# Patient Record
Sex: Male | Born: 1955 | Race: White | Hispanic: No | Smoking: Current every day smoker
Health system: Southern US, Community
[De-identification: ages and names within clinical notes are randomized; demographics above are authoritative.]

## PROBLEM LIST (undated history)

## (undated) DIAGNOSIS — J189 Pneumonia, unspecified organism: Secondary | ICD-10-CM

## (undated) DIAGNOSIS — I10 Essential (primary) hypertension: Secondary | ICD-10-CM

## (undated) DIAGNOSIS — Z96649 Presence of unspecified artificial hip joint: Secondary | ICD-10-CM

## (undated) DIAGNOSIS — I82409 Acute embolism and thrombosis of unspecified deep veins of unspecified lower extremity: Secondary | ICD-10-CM

## (undated) DIAGNOSIS — R4589 Other symptoms and signs involving emotional state: Secondary | ICD-10-CM

## (undated) DIAGNOSIS — J449 Chronic obstructive pulmonary disease, unspecified: Secondary | ICD-10-CM

## (undated) DIAGNOSIS — E119 Type 2 diabetes mellitus without complications: Secondary | ICD-10-CM

## (undated) HISTORY — PX: APPENDECTOMY: SHX54

## (undated) HISTORY — PX: HIP ARTHROPLASTY: SHX981

---

## 2010-08-21 ENCOUNTER — Ambulatory Visit: Payer: Self-pay | Admitting: Diagnostic Radiology

## 2010-08-21 ENCOUNTER — Emergency Department (HOSPITAL_BASED_OUTPATIENT_CLINIC_OR_DEPARTMENT_OTHER): Admission: EM | Admit: 2010-08-21 | Discharge: 2010-08-22 | Payer: Self-pay | Admitting: Emergency Medicine

## 2012-10-15 ENCOUNTER — Emergency Department (HOSPITAL_BASED_OUTPATIENT_CLINIC_OR_DEPARTMENT_OTHER): Payer: Medicare Other

## 2012-10-15 ENCOUNTER — Encounter (HOSPITAL_BASED_OUTPATIENT_CLINIC_OR_DEPARTMENT_OTHER): Payer: Self-pay | Admitting: *Deleted

## 2012-10-15 DIAGNOSIS — J40 Bronchitis, not specified as acute or chronic: Secondary | ICD-10-CM | POA: Insufficient documentation

## 2012-10-15 DIAGNOSIS — Z86718 Personal history of other venous thrombosis and embolism: Secondary | ICD-10-CM | POA: Insufficient documentation

## 2012-10-15 DIAGNOSIS — J3489 Other specified disorders of nose and nasal sinuses: Secondary | ICD-10-CM | POA: Insufficient documentation

## 2012-10-15 DIAGNOSIS — I10 Essential (primary) hypertension: Secondary | ICD-10-CM | POA: Insufficient documentation

## 2012-10-15 DIAGNOSIS — R6883 Chills (without fever): Secondary | ICD-10-CM | POA: Insufficient documentation

## 2012-10-15 DIAGNOSIS — R071 Chest pain on breathing: Secondary | ICD-10-CM | POA: Insufficient documentation

## 2012-10-15 DIAGNOSIS — F17200 Nicotine dependence, unspecified, uncomplicated: Secondary | ICD-10-CM | POA: Insufficient documentation

## 2012-10-15 DIAGNOSIS — E119 Type 2 diabetes mellitus without complications: Secondary | ICD-10-CM | POA: Insufficient documentation

## 2012-10-15 DIAGNOSIS — Z8709 Personal history of other diseases of the respiratory system: Secondary | ICD-10-CM | POA: Insufficient documentation

## 2012-10-15 DIAGNOSIS — Z8659 Personal history of other mental and behavioral disorders: Secondary | ICD-10-CM | POA: Insufficient documentation

## 2012-10-15 DIAGNOSIS — Z79899 Other long term (current) drug therapy: Secondary | ICD-10-CM | POA: Insufficient documentation

## 2012-10-15 DIAGNOSIS — R062 Wheezing: Secondary | ICD-10-CM | POA: Insufficient documentation

## 2012-10-15 DIAGNOSIS — Z7901 Long term (current) use of anticoagulants: Secondary | ICD-10-CM | POA: Insufficient documentation

## 2012-10-15 DIAGNOSIS — Z794 Long term (current) use of insulin: Secondary | ICD-10-CM | POA: Insufficient documentation

## 2012-10-15 NOTE — ED Notes (Signed)
Pt. Reports he has been coughing up dark colored mucus for 3 days with c/o his sides hurting. Pt. Has been around persons with same symptoms.  Pt. Reports he needs a Dr.s note for the next couple of days.

## 2012-10-16 ENCOUNTER — Emergency Department (HOSPITAL_BASED_OUTPATIENT_CLINIC_OR_DEPARTMENT_OTHER)
Admission: EM | Admit: 2012-10-16 | Discharge: 2012-10-16 | Disposition: A | Discharge status: Home / Self Care | Payer: Medicare Other | Attending: Emergency Medicine | Admitting: Emergency Medicine

## 2012-10-16 DIAGNOSIS — J40 Bronchitis, not specified as acute or chronic: Secondary | ICD-10-CM

## 2012-10-16 HISTORY — DX: Essential (primary) hypertension: I10

## 2012-10-16 HISTORY — DX: Type 2 diabetes mellitus without complications: E11.9

## 2012-10-16 HISTORY — DX: Acute embolism and thrombosis of unspecified deep veins of unspecified lower extremity: I82.409

## 2012-10-16 HISTORY — DX: Other symptoms and signs involving emotional state: R45.89

## 2012-10-16 MED ORDER — ALBUTEROL SULFATE (5 MG/ML) 0.5% IN NEBU
5.0000 mg | INHALATION_SOLUTION | Freq: Once | RESPIRATORY_TRACT | Status: AC
  Administered 2012-10-16: 5 mg via RESPIRATORY_TRACT

## 2012-10-16 MED ORDER — ALBUTEROL SULFATE HFA 108 (90 BASE) MCG/ACT IN AERS: 2.0000 | INHALATION_SPRAY | RESPIRATORY_TRACT | Status: DC

## 2012-10-16 MED ORDER — IPRATROPIUM BROMIDE 0.02 % IN SOLN
0.5000 mg | Freq: Once | RESPIRATORY_TRACT | Status: AC
  Administered 2012-10-16: 0.5 mg via RESPIRATORY_TRACT

## 2012-10-16 MED ORDER — IPRATROPIUM BROMIDE 0.02 % IN SOLN
RESPIRATORY_TRACT | Status: AC
  Administered 2012-10-16: 0.5 mg via RESPIRATORY_TRACT
  Filled 2012-10-16: qty 2.5

## 2012-10-16 MED ORDER — PREDNISONE 50 MG PO TABS
60.0000 mg | ORAL_TABLET | Freq: Once | ORAL | Status: AC
  Administered 2012-10-16: 60 mg via ORAL
  Filled 2012-10-16: qty 1

## 2012-10-16 MED ORDER — OXYCODONE-ACETAMINOPHEN 5-325 MG PO TABS
2.0000 | ORAL_TABLET | Freq: Once | ORAL | Status: AC
  Administered 2012-10-16: 2 via ORAL
  Filled 2012-10-16 (×2): qty 2

## 2012-10-16 MED ORDER — ALBUTEROL SULFATE (5 MG/ML) 0.5% IN NEBU
5.0000 mg | INHALATION_SOLUTION | Freq: Once | RESPIRATORY_TRACT | Status: AC
  Administered 2012-10-16: 5 mg via RESPIRATORY_TRACT
  Filled 2012-10-16: qty 1

## 2012-10-16 MED ORDER — OXYCODONE-ACETAMINOPHEN 5-325 MG PO TABS: 1.0000 | ORAL_TABLET | Freq: Three times a day (TID) | ORAL | Status: DC | PRN

## 2012-10-16 MED ORDER — ALBUTEROL SULFATE (5 MG/ML) 0.5% IN NEBU
INHALATION_SOLUTION | RESPIRATORY_TRACT | Status: AC
  Administered 2012-10-16: 5 mg via RESPIRATORY_TRACT
  Filled 2012-10-16: qty 1

## 2012-10-16 MED ORDER — ALBUTEROL SULFATE HFA 108 (90 BASE) MCG/ACT IN AERS
INHALATION_SPRAY | RESPIRATORY_TRACT | Status: AC
  Filled 2012-10-16: qty 6.7

## 2012-10-16 MED ORDER — PREDNISONE 50 MG PO TABS: ORAL_TABLET | ORAL | Status: DC

## 2012-10-16 NOTE — ED Notes (Signed)
RT at bs, neb tx in progress, pt tolerating well.

## 2012-10-16 NOTE — ED Notes (Signed)
MD at bedside. 

## 2012-10-16 NOTE — ED Provider Notes (Signed)
History  This chart was scribed for Joya Gaskins, MD by Manuela Schwartz, ED scribe. This patient was seen in room MH01/MH01 and the patient's care was started at 2326.   CSN: [id number]  Arrival date & time 10/15/12  2326   First MD Initiated Contact with Patient 10/16/12 [phone]      Chief Complaint  Patient presents with  . Cough   Patient is a 56 y.o. male presenting with cough. The history is provided by the patient. No language interpreter was used.  Cough This is a recurrent problem. The current episode started more than 2 days ago. The problem occurs constantly. The problem has been gradually worsening. The cough is productive of brown sputum. Associated symptoms include chills, rhinorrhea and wheezing. Pertinent negatives include no shortness of breath. He has tried nothing for the symptoms. He is a smoker. His past medical history is significant for bronchitis and asthma.   Raymond Kim is a 56 y.o. male who presents to the Emergency Department w/hx of asthma/bronchitis complaining of constant gradually worsening green colored productive cough for the past 3 days with associated chills, no fever. He states rhinorrhea, pleuritic chest pain. He denies SOB. He is a smoker.   Past Medical History  Diagnosis Date  . DVT (deep venous thrombosis)     in legs and chest in past.  . Depressed affect   . Hypertension   . Diabetes mellitus without complication     Past Surgical History  Procedure Date  . Hip arthroplasty   . Appendectomy     No family history on file.  History  Substance Use Topics  . Smoking status: Current Every Day Smoker -- 1.0 packs/day    Types: Cigarettes  . Smokeless tobacco: Not on file  . Alcohol Use: No      Review of Systems  Constitutional: Positive for chills. Negative for fever.  HENT: Positive for rhinorrhea.   Respiratory: Positive for cough and wheezing. Negative for shortness of breath.   Gastrointestinal: Negative for nausea and  vomiting.  Musculoskeletal: Negative for back pain.  Neurological: Negative for weakness.  All other systems reviewed and are negative.    Allergies  Review of patient's allergies indicates no known allergies.  Home Medications   Current Outpatient Rx  Name  Route  Sig  Dispense  Refill  . BUPROPION HCL ER (SR) 150 MG PO TB12   Oral   Take 150 mg by mouth 2 (two) times daily.         Marland Kitchen ESCITALOPRAM OXALATE 20 MG PO TABS   Oral   Take 20 mg by mouth daily.         Marland Kitchen GLIPIZIDE 10 MG PO TABS   Oral   Take 10 mg by mouth 2 (two) times daily before a meal.         . INSULIN ASPART PROT & ASPART (70-30) 100 UNIT/ML  SUSP   Subcutaneous   Inject into the skin 2 (two) times daily with a meal.         . LISINOPRIL 20 MG PO TABS   Oral   Take 20 mg by mouth daily.         Marland Kitchen METFORMIN HCL 1000 MG PO TABS   Oral   Take 1,000 mg by mouth 1 day or 1 dose.         . WARFARIN SODIUM 1 MG PO TABS   Oral   Take 25 mg by mouth daily.  Triage Vitals: BP 168/72  Pulse 92  Temp 97.6 F (36.4 C) (Oral)  Resp 22  Ht 5\' 8"  (1.727 m)  Wt 356 lb (161.481 kg)  BMI 54.13 kg/m2  SpO2 92%  Physical Exam CONSTITUTIONAL: Well developed/well nourished HEAD AND FACE: Normocephalic/atraumatic EYES: EOMI/PERRL ENMT: Mucous membranes moist NECK: supple no meningeal signs SPINE:entire spine nontender CV: S1/S2 noted, no murmurs/rubs/gallops noted Chest - tender to palpation, no crepitance LUNGS: , no apparent distress, wheezing bialterally ABDOMEN: soft, nontender, no rebound or guarding, obese GU:no cva tenderness NEURO: Pt is awake/alert, moves all extremitiesx4 EXTREMITIES: pulses normal, full ROM, no edema SKIN: warm, color normal PSYCH: no abnormalities of mood noted  ED Course  Procedures DIAGNOSTIC STUDIES: Oxygen Saturation is 95% on room air, adequate by my interpretation.    COORDINATION OF CARE: At 1255 AM Discussed treatment plan with patient  which includes breathing treatment, CXR. Patient agrees.   Labs Reviewed - No data to display Dg Chest 2 View  10/16/2012  *RADIOLOGY REPORT*  Clinical Data: Cough and lower back pain.  Shortness of breath. History of smoking.  CHEST - 2 VIEW  Comparison: None.  Findings: The lungs are well-aerated.  Mild vascular congestion is noted.  There is no evidence of focal opacification, pleural effusion or pneumothorax.  The heart is borderline normal in size; the mediastinal contour is within normal limits.  No acute osseous abnormalities are seen.  IMPRESSION: Mild vascular congestion noted; lungs remain grossly clear.   Original Report Authenticated By: Tonia Ghent, M.D.    2:17 AM   Pt improved.  He is in no distress, no tachypnea.  His lung sounds are improved.  Suspect bronchitis (pt is a smoker) I doubt CHF/PE.  He has focal chest wall tenderness due to cough.  Advised to quit smoking.  Also discussed strict return precautions.  He is aware that prednisone will increase glucose and need for close monitoring Defer antibiotics for now.  Does not appear to be influenza   MDM  Nursing notes including past medical history and social history reviewed and considered in documentation xrays reviewed and considered    I personally performed the services described in this documentation, which was scribed in my presence. The recorded information has been reviewed and is accurate.           Joya Gaskins, MD 10/16/12 [phone]

## 2012-10-16 NOTE — Progress Notes (Signed)
RT instructed patient in the correct use of a MDI with a spacer.  Patient demonstarted understanding of the correct procedure for using a MDI with a spacer.

## 2012-10-16 NOTE — ED Notes (Signed)
C/o cough and right-sided rib pain with coughing. Pt denies fever or chills.

## 2012-10-20 ENCOUNTER — Encounter (HOSPITAL_BASED_OUTPATIENT_CLINIC_OR_DEPARTMENT_OTHER): Payer: Self-pay | Admitting: *Deleted

## 2012-10-20 ENCOUNTER — Emergency Department (HOSPITAL_BASED_OUTPATIENT_CLINIC_OR_DEPARTMENT_OTHER): Payer: Medicare Other

## 2012-10-20 ENCOUNTER — Emergency Department (HOSPITAL_BASED_OUTPATIENT_CLINIC_OR_DEPARTMENT_OTHER)
Admission: EM | Admit: 2012-10-20 | Discharge: 2012-10-20 | Disposition: A | Discharge status: Home / Self Care | Payer: Medicare Other | Attending: Emergency Medicine | Admitting: Emergency Medicine

## 2012-10-20 DIAGNOSIS — F329 Major depressive disorder, single episode, unspecified: Secondary | ICD-10-CM | POA: Insufficient documentation

## 2012-10-20 DIAGNOSIS — F3289 Other specified depressive episodes: Secondary | ICD-10-CM | POA: Insufficient documentation

## 2012-10-20 DIAGNOSIS — R739 Hyperglycemia, unspecified: Secondary | ICD-10-CM

## 2012-10-20 DIAGNOSIS — Z7901 Long term (current) use of anticoagulants: Secondary | ICD-10-CM | POA: Insufficient documentation

## 2012-10-20 DIAGNOSIS — Z79899 Other long term (current) drug therapy: Secondary | ICD-10-CM | POA: Insufficient documentation

## 2012-10-20 DIAGNOSIS — Z794 Long term (current) use of insulin: Secondary | ICD-10-CM | POA: Insufficient documentation

## 2012-10-20 DIAGNOSIS — J45901 Unspecified asthma with (acute) exacerbation: Secondary | ICD-10-CM | POA: Insufficient documentation

## 2012-10-20 DIAGNOSIS — E1169 Type 2 diabetes mellitus with other specified complication: Secondary | ICD-10-CM | POA: Insufficient documentation

## 2012-10-20 DIAGNOSIS — I10 Essential (primary) hypertension: Secondary | ICD-10-CM | POA: Insufficient documentation

## 2012-10-20 DIAGNOSIS — F17200 Nicotine dependence, unspecified, uncomplicated: Secondary | ICD-10-CM | POA: Insufficient documentation

## 2012-10-20 DIAGNOSIS — I82409 Acute embolism and thrombosis of unspecified deep veins of unspecified lower extremity: Secondary | ICD-10-CM | POA: Insufficient documentation

## 2012-10-20 LAB — COMPREHENSIVE METABOLIC PANEL
Alkaline Phosphatase: 77 U/L (ref 39–117)
BUN: 25 mg/dL — ABNORMAL HIGH (ref 6–23)
CO2: 27 mEq/L (ref 19–32)
Calcium: 9.6 mg/dL (ref 8.4–10.5)
GFR calc Af Amer: >90 mL/min (ref >90)
GFR calc non Af Amer: >90 mL/min (ref >90)
Glucose, Bld: 326 mg/dL — ABNORMAL HIGH (ref 70–99)
Potassium: 4.1 mEq/L (ref 3.5–5.1)
Total Protein: 6.9 g/dL (ref 6.0–8.3)

## 2012-10-20 LAB — CBC WITH DIFFERENTIAL/PLATELET
Eosinophils Absolute: 0.1 10*3/uL (ref 0.0–0.7)
Eosinophils Relative: 1 % (ref 0–5)
Hemoglobin: 13 g/dL (ref 13.0–17.0)
Lymphocytes Relative: 42 % (ref 12–46)
Lymphs Abs: 3.6 10*3/uL (ref 0.7–4.0)
MCH: 30.4 pg (ref 26.0–34.0)
MCV: 97.9 fL (ref 78.0–100.0)
Monocytes Relative: 6 % (ref 3–12)
RBC: 4.28 MIL/uL (ref 4.22–5.81)
WBC: 8.5 10*3/uL (ref 4.0–10.5)

## 2012-10-20 LAB — TROPONIN I: Troponin I: <0.3 ng/mL (ref <0.30)

## 2012-10-20 MED ORDER — INSULIN REGULAR HUMAN 100 UNIT/ML IJ SOLN
10.0000 [IU] | Freq: Once | INTRAMUSCULAR | Status: AC
  Administered 2012-10-20: 10 [IU] via INTRAVENOUS

## 2012-10-20 MED ORDER — SODIUM CHLORIDE 0.9 % IV BOLUS (SEPSIS)
2000.0000 mL | Freq: Once | INTRAVENOUS | Status: AC
  Administered 2012-10-20: 2000 mL via INTRAVENOUS

## 2012-10-20 MED ORDER — INSULIN REGULAR HUMAN 100 UNIT/ML IJ SOLN
INTRAMUSCULAR | Status: AC
  Filled 2012-10-20: qty 1

## 2012-10-20 MED ORDER — ALBUTEROL SULFATE (5 MG/ML) 0.5% IN NEBU
5.0000 mg | INHALATION_SOLUTION | RESPIRATORY_TRACT | Status: AC
  Administered 2012-10-20 (×3): 5 mg via RESPIRATORY_TRACT
  Filled 2012-10-20 (×3): qty 1

## 2012-10-20 MED ORDER — ALBUTEROL SULFATE HFA 108 (90 BASE) MCG/ACT IN AERS: 1.0000 | INHALATION_SPRAY | Freq: Four times a day (QID) | RESPIRATORY_TRACT | Status: DC | PRN

## 2012-10-20 MED ORDER — IPRATROPIUM BROMIDE 0.02 % IN SOLN
0.5000 mg | RESPIRATORY_TRACT | Status: AC
  Administered 2012-10-20 (×3): 0.5 mg via RESPIRATORY_TRACT
  Filled 2012-10-20 (×3): qty 2.5

## 2012-10-20 NOTE — ED Provider Notes (Addendum)
History     CSN: [id number]  Arrival date & time 10/20/12  0123   First MD Initiated Contact with Patient 10/20/12 0134      Chief Complaint  Patient presents with  . Shortness of Breath    (Consider location/radiation/quality/duration/timing/severity/associated sxs/prior treatment) The history is provided by the patient.  Raymond Kim is a 57 y.o. male history of hypertension, DM, DVT on Coumadin here with shortness of breath and cough. He has a nonproductive cough for the last 2 weeks. Came a week ago and was prescribed steroids and albuterol. He finished steroids today. Now he has more cough. He also ran out of his insulin yesterday and his sugars were elevated. Denies any fevers or chills or vomiting. Able to get his insulin couple hours ago and gave himself a shot of novolog.    Past Medical History  Diagnosis Date  . DVT (deep venous thrombosis)     in legs and chest in past.  . Depressed affect   . Hypertension   . Diabetes mellitus without complication     Past Surgical History  Procedure Date  . Hip arthroplasty   . Appendectomy     History reviewed. No pertinent family history.  History  Substance Use Topics  . Smoking status: Current Every Day Smoker -- 1.0 packs/day    Types: Cigarettes  . Smokeless tobacco: Not on file  . Alcohol Use: No      Review of Systems  Respiratory: Positive for cough and shortness of breath.   All other systems reviewed and are negative.    Allergies  Review of patient's allergies indicates no known allergies.  Home Medications   Current Outpatient Rx  Name  Route  Sig  Dispense  Refill  . BUPROPION HCL ER (SR) 150 MG PO TB12   Oral   Take 150 mg by mouth 2 (two) times daily.         Marland Kitchen ESCITALOPRAM OXALATE 20 MG PO TABS   Oral   Take 20 mg by mouth daily.         Marland Kitchen GLIPIZIDE 10 MG PO TABS   Oral   Take 10 mg by mouth 2 (two) times daily before a meal.         . INSULIN ASPART PROT & ASPART (70-30) 100  UNIT/ML [place] SUSP   Subcutaneous   Inject into the skin 2 (two) times daily with a meal.         . LISINOPRIL 20 MG PO TABS   Oral   Take 20 mg by mouth daily.         Marland Kitchen METFORMIN HCL 1000 MG PO TABS   Oral   Take 1,000 mg by mouth 1 day or 1 dose.         Marland Kitchen PREDNISONE 50 MG PO TABS      One tablet PO daily for 4 days Please check your sugar frequently and adjust insulin as needed   4 tablet   0   . WARFARIN SODIUM 1 MG PO TABS   Oral   Take 25 mg by mouth daily.         . OXYCODONE-ACETAMINOPHEN 5-325 MG PO TABS   Oral   Take 1 tablet by mouth every 8 (eight) hours as needed for pain.   5 tablet   0     BP 172/64  Pulse 92  Temp 98.9 F (37.2 C) (Oral)  Resp 20  SpO2 93%  Physical Exam  Nursing note and vitals reviewed. Constitutional: He is oriented to person, place, and time.       Obese, NAD   HENT:  Head: Normocephalic.  Mouth/Throat: Oropharynx is clear and moist.  Eyes: Conjunctivae normal are normal. Pupils are equal, round, and reactive to light.  Neck: Normal range of motion. Neck supple.  Cardiovascular: Normal rate, regular rhythm and normal heart sounds.   Pulmonary/Chest: Effort normal.       Minimal wheezing, no retractions, good air movement   Abdominal: Soft. Bowel sounds are normal. He exhibits no distension. There is no tenderness. There is no rebound.  Musculoskeletal: Normal range of motion.  Neurological: He is alert and oriented to person, place, and time.  Skin: Skin is warm and dry.  Psychiatric: He has a normal mood and affect. His behavior is normal. Judgment and thought content normal.    ED Course  Procedures (including critical care time)  Labs Reviewed  GLUCOSE, CAPILLARY - Abnormal; Notable for the following:    Glucose-Capillary 538 (*)     All other components within normal limits  CBC WITH DIFFERENTIAL - Abnormal; Notable for the following:    RDW 15.9 (*)     Platelets 143 (*)     All other components within  normal limits  COMPREHENSIVE METABOLIC PANEL - Abnormal; Notable for the following:    Glucose, Bld 326 (*)     BUN 25 (*)     All other components within normal limits  GLUCOSE, CAPILLARY - Abnormal; Notable for the following:    Glucose-Capillary 309 (*)     All other components within normal limits  TROPONIN I   Dg Chest 2 View  10/20/2012  *RADIOLOGY REPORT*  Clinical Data: Cough history smoking, hypertension, diabetes  CHEST - 2 VIEW  Comparison: 10/16/2012  Findings: Minimal enlargement of cardiac silhouette. Mediastinal contours and pulmonary vascularity normal. Peribronchial thickening without pulmonary infiltrate, pleural effusion or pneumothorax. No acute osseous findings.  IMPRESSION: Mild chronic bronchitic changes. Minimal enlargement of cardiac silhouette.   Original Report Authenticated By: Ulyses Southward, M.D.      No diagnosis found.   Date: 10/20/2012  Rate: 86  Rhythm: normal sinus rhythm  QRS Axis: normal  Intervals: normal  ST/T Wave abnormalities: normal  Conduction Disutrbances:none  Narrative Interpretation:   Old EKG Reviewed: none available    MDM  Jewett Mcgann is a 57 y.o. male here with cough, SOB, wheezing, and hyperglycemia. Likely has mild asthma exacerbation. CXR clear. Will give albuterol x 3 and reassess. I held off of steroids due to hyperglycemia. CBG was 500 initially, dec to 300 with 2 L NS and 10 U regular insulin. No anion gap on CMP.   4:37 AM Patient felt better. No wheezing on exam. Never hypoxic here. Will d/c home with albuterol. I recommend that he take his insulin appropriately. Return precautions given.           Richardean Canal, MD 10/20/12 1610  Richardean Canal, MD 10/20/12 [phone]

## 2012-10-20 NOTE — ED Notes (Signed)
MD at bedside. 

## 2012-10-20 NOTE — ED Notes (Addendum)
Pt c/o SOB x 1 week, has been seen here for same this week. Pt states he needs some more steroid pills, finished his last dose this morning.

## 2012-10-20 NOTE — ED Notes (Signed)
RTT at bedside

## 2012-10-20 NOTE — ED Notes (Signed)
Patient's blood glucose 538. MD made aware.

## 2012-10-20 NOTE — ED Notes (Signed)
2nd liter of NS infusing at this time, IV site unremarkable

## 2012-10-20 NOTE — ED Notes (Signed)
CBG 309 

## 2013-01-04 ENCOUNTER — Emergency Department (HOSPITAL_BASED_OUTPATIENT_CLINIC_OR_DEPARTMENT_OTHER): Payer: Medicare Other

## 2013-01-04 ENCOUNTER — Encounter (HOSPITAL_BASED_OUTPATIENT_CLINIC_OR_DEPARTMENT_OTHER): Payer: Self-pay | Admitting: Emergency Medicine

## 2013-01-04 ENCOUNTER — Emergency Department (HOSPITAL_BASED_OUTPATIENT_CLINIC_OR_DEPARTMENT_OTHER)
Admission: EM | Admit: 2013-01-04 | Discharge: 2013-01-04 | Disposition: A | Discharge status: Home / Self Care | Payer: Medicare Other | Attending: Emergency Medicine | Admitting: Emergency Medicine

## 2013-01-04 DIAGNOSIS — Z794 Long term (current) use of insulin: Secondary | ICD-10-CM | POA: Insufficient documentation

## 2013-01-04 DIAGNOSIS — I10 Essential (primary) hypertension: Secondary | ICD-10-CM | POA: Insufficient documentation

## 2013-01-04 DIAGNOSIS — Z79899 Other long term (current) drug therapy: Secondary | ICD-10-CM | POA: Insufficient documentation

## 2013-01-04 DIAGNOSIS — F3289 Other specified depressive episodes: Secondary | ICD-10-CM | POA: Insufficient documentation

## 2013-01-04 DIAGNOSIS — F17200 Nicotine dependence, unspecified, uncomplicated: Secondary | ICD-10-CM | POA: Insufficient documentation

## 2013-01-04 DIAGNOSIS — IMO0002 Reserved for concepts with insufficient information to code with codable children: Secondary | ICD-10-CM | POA: Insufficient documentation

## 2013-01-04 DIAGNOSIS — E119 Type 2 diabetes mellitus without complications: Secondary | ICD-10-CM | POA: Insufficient documentation

## 2013-01-04 DIAGNOSIS — F329 Major depressive disorder, single episode, unspecified: Secondary | ICD-10-CM | POA: Insufficient documentation

## 2013-01-04 DIAGNOSIS — S8002XA Contusion of left knee, initial encounter: Secondary | ICD-10-CM

## 2013-01-04 DIAGNOSIS — Z7901 Long term (current) use of anticoagulants: Secondary | ICD-10-CM | POA: Insufficient documentation

## 2013-01-04 DIAGNOSIS — Z86718 Personal history of other venous thrombosis and embolism: Secondary | ICD-10-CM | POA: Insufficient documentation

## 2013-01-04 DIAGNOSIS — S8000XA Contusion of unspecified knee, initial encounter: Secondary | ICD-10-CM | POA: Insufficient documentation

## 2013-01-04 DIAGNOSIS — Y9301 Activity, walking, marching and hiking: Secondary | ICD-10-CM | POA: Insufficient documentation

## 2013-01-04 DIAGNOSIS — Y929 Unspecified place or not applicable: Secondary | ICD-10-CM | POA: Insufficient documentation

## 2013-01-04 MED ORDER — HYDROCODONE-ACETAMINOPHEN 5-325 MG PO TABS: 2.0000 | ORAL_TABLET | ORAL | Status: AC | PRN

## 2013-01-04 NOTE — ED Provider Notes (Signed)
History     CSN: [id number]  Arrival date & time 01/04/13  1704   First MD Initiated Contact with Patient 01/04/13 1748      Chief Complaint  Patient presents with  . Knee Injury  . Back Pain    (Consider location/radiation/quality/duration/timing/severity/associated sxs/prior treatment) Patient is a 57 y.o. male presenting with knee pain. The history is provided by the patient.  Knee Pain Location:  Knee Time since incident:  4 days Injury: yes   Mechanism of injury comment:  Struck on tool box Knee location:  L knee Pain details:    Quality:  Throbbing   Radiates to:  Does not radiate   Severity:  Severe   Onset quality:  Sudden   Duration:  4 days   Timing:  Constant   Progression:  Worsening Chronicity:  New Dislocation: no   Foreign body present:  No foreign bodies Prior injury to area:  Yes Relieved by:  Nothing Worsened by:  Activity, bearing weight, flexion and extension Ineffective treatments:  None tried Associated symptoms: no fever     Past Medical History  Diagnosis Date  . DVT (deep venous thrombosis)     in legs and chest in past.  . Depressed affect   . Hypertension   . Diabetes mellitus without complication     Past Surgical History  Procedure Laterality Date  . Hip arthroplasty    . Appendectomy      No family history on file.  History  Substance Use Topics  . Smoking status: Current Every Day Smoker -- 1.00 packs/day    Types: Cigarettes  . Smokeless tobacco: Not on file  . Alcohol Use: No      Review of Systems  Constitutional: Negative for fever.  All other systems reviewed and are negative.    Allergies  Review of patient's allergies indicates no known allergies.  Home Medications   Current Outpatient Rx  Name  Route  Sig  Dispense  Refill  . buPROPion (WELLBUTRIN SR) 150 MG 12 hr tablet   Oral   Take 150 mg by mouth 2 (two) times daily.         Marland Kitchen escitalopram (LEXAPRO) 20 MG tablet   Oral   Take 20 mg by  mouth daily.         Marland Kitchen glipiZIDE (GLUCOTROL) 10 MG tablet   Oral   Take 10 mg by mouth 2 (two) times daily before a meal.         . insulin aspart protamine-insulin aspart (NOVOLOG 70/30) (70-30) 100 UNIT/ML injection   Subcutaneous   Inject into the skin 2 (two) times daily with a meal.         . lisinopril (PRINIVIL,ZESTRIL) 20 MG tablet   Oral   Take 20 mg by mouth daily.         . metFORMIN (GLUCOPHAGE) 1000 MG tablet   Oral   Take 1,000 mg by mouth 1 day or 1 dose.         . warfarin (COUMADIN) 1 MG tablet   Oral   Take 25 mg by mouth daily.           BP 120/78  Pulse 95  Temp(Src) 98.1 F (36.7 C) (Oral)  Resp 20  Ht 5\' 8"  (1.727 m)  Wt 340 lb (154.223 kg)  BMI 51.71 kg/m2  SpO2 96%  Physical Exam  Nursing note and vitals reviewed. Constitutional: He is oriented to person, place, and time. He appears well-developed  and well-nourished. No distress.  HENT:  Head: Normocephalic and atraumatic.  Mouth/Throat: Oropharynx is clear and moist.  Neck: Normal range of motion. Neck supple.  Musculoskeletal: Normal range of motion.  The left knee appears grossly normal.  There is mild swelling inferior to the patella, but no effusion.  There is pain with range of motion limiting the exam, however the knee appears stable ap and laterally.    Neurological: He is alert and oriented to person, place, and time.  Skin: Skin is warm. He is not diaphoretic.    ED Course  Procedures (including critical care time)  Labs Reviewed - No data to display No results found.   No diagnosis found.    MDM  Xrays negative, appears to be a contusion.  Will treat with rest, pain meds.  Follow up with his orthopedist prn if not improving.        Geoffery Lyons, MD 01/04/13 [phone]

## 2013-01-04 NOTE — ED Notes (Signed)
Pt walked into a tool box three days ago.  Pt c/o pain in left knee and lower back.

## 2013-01-18 ENCOUNTER — Emergency Department (HOSPITAL_BASED_OUTPATIENT_CLINIC_OR_DEPARTMENT_OTHER)
Admission: EM | Admit: 2013-01-18 | Discharge: 2013-01-18 | Disposition: A | Discharge status: Home / Self Care | Payer: Medicare Other | Attending: Emergency Medicine | Admitting: Emergency Medicine

## 2013-01-18 ENCOUNTER — Encounter (HOSPITAL_BASED_OUTPATIENT_CLINIC_OR_DEPARTMENT_OTHER): Payer: Self-pay | Admitting: Emergency Medicine

## 2013-01-18 ENCOUNTER — Emergency Department (HOSPITAL_BASED_OUTPATIENT_CLINIC_OR_DEPARTMENT_OTHER): Payer: Medicare Other

## 2013-01-18 DIAGNOSIS — I10 Essential (primary) hypertension: Secondary | ICD-10-CM | POA: Insufficient documentation

## 2013-01-18 DIAGNOSIS — E669 Obesity, unspecified: Secondary | ICD-10-CM | POA: Insufficient documentation

## 2013-01-18 DIAGNOSIS — F3289 Other specified depressive episodes: Secondary | ICD-10-CM | POA: Insufficient documentation

## 2013-01-18 DIAGNOSIS — Z794 Long term (current) use of insulin: Secondary | ICD-10-CM | POA: Insufficient documentation

## 2013-01-18 DIAGNOSIS — Z86718 Personal history of other venous thrombosis and embolism: Secondary | ICD-10-CM | POA: Insufficient documentation

## 2013-01-18 DIAGNOSIS — F17200 Nicotine dependence, unspecified, uncomplicated: Secondary | ICD-10-CM | POA: Insufficient documentation

## 2013-01-18 DIAGNOSIS — R05 Cough: Secondary | ICD-10-CM | POA: Insufficient documentation

## 2013-01-18 DIAGNOSIS — F329 Major depressive disorder, single episode, unspecified: Secondary | ICD-10-CM | POA: Insufficient documentation

## 2013-01-18 DIAGNOSIS — R0602 Shortness of breath: Secondary | ICD-10-CM | POA: Insufficient documentation

## 2013-01-18 DIAGNOSIS — J209 Acute bronchitis, unspecified: Secondary | ICD-10-CM | POA: Insufficient documentation

## 2013-01-18 DIAGNOSIS — J40 Bronchitis, not specified as acute or chronic: Secondary | ICD-10-CM

## 2013-01-18 DIAGNOSIS — Z7901 Long term (current) use of anticoagulants: Secondary | ICD-10-CM | POA: Insufficient documentation

## 2013-01-18 DIAGNOSIS — M549 Dorsalgia, unspecified: Secondary | ICD-10-CM | POA: Insufficient documentation

## 2013-01-18 DIAGNOSIS — R059 Cough, unspecified: Secondary | ICD-10-CM | POA: Insufficient documentation

## 2013-01-18 DIAGNOSIS — E119 Type 2 diabetes mellitus without complications: Secondary | ICD-10-CM | POA: Insufficient documentation

## 2013-01-18 DIAGNOSIS — J45909 Unspecified asthma, uncomplicated: Secondary | ICD-10-CM | POA: Insufficient documentation

## 2013-01-18 DIAGNOSIS — Z79899 Other long term (current) drug therapy: Secondary | ICD-10-CM | POA: Insufficient documentation

## 2013-01-18 MED ORDER — AZITHROMYCIN 250 MG PO TABS: 250.0000 mg | ORAL_TABLET | Freq: Every day | ORAL | Status: AC

## 2013-01-18 MED ORDER — ALBUTEROL SULFATE HFA 108 (90 BASE) MCG/ACT IN AERS
2.0000 | INHALATION_SPRAY | RESPIRATORY_TRACT | Status: DC | PRN
  Administered 2013-01-18: 2 via RESPIRATORY_TRACT
  Filled 2013-01-18: qty 6.7

## 2013-01-18 MED ORDER — IPRATROPIUM BROMIDE 0.02 % IN SOLN
0.5000 mg | Freq: Once | RESPIRATORY_TRACT | Status: AC
  Administered 2013-01-18: 0.5 mg via RESPIRATORY_TRACT
  Filled 2013-01-18: qty 2.5

## 2013-01-18 MED ORDER — HYDROCODONE-HOMATROPINE 5-1.5 MG/5ML PO SYRP: 5.0000 mL | ORAL_SOLUTION | Freq: Four times a day (QID) | ORAL | Status: AC | PRN

## 2013-01-18 MED ORDER — PREDNISONE 20 MG PO TABS
20.0000 mg | ORAL_TABLET | Freq: Once | ORAL | Status: AC
  Administered 2013-01-18: 20 mg via ORAL
  Filled 2013-01-18: qty 1

## 2013-01-18 MED ORDER — PREDNISONE 20 MG PO TABS: 20.0000 mg | ORAL_TABLET | Freq: Two times a day (BID) | ORAL | Status: AC

## 2013-01-18 MED ORDER — ALBUTEROL SULFATE (5 MG/ML) 0.5% IN NEBU
5.0000 mg | INHALATION_SOLUTION | Freq: Once | RESPIRATORY_TRACT | Status: AC
  Administered 2013-01-18: 5 mg via RESPIRATORY_TRACT
  Filled 2013-01-18: qty 1

## 2013-01-18 NOTE — ED Notes (Signed)
Family at bedside. 

## 2013-01-18 NOTE — Discharge Instructions (Signed)

## 2013-01-18 NOTE — Patient Instructions (Signed)
Instructed pt on the proper use of administering albuteral mdi via aerochamber pt tolerated well 

## 2013-01-18 NOTE — ED Notes (Signed)
MD at bedside. 

## 2013-01-18 NOTE — ED Notes (Signed)
SOB x4 days.  Audible wheezing.  Rales bil.

## 2013-01-18 NOTE — ED Provider Notes (Signed)
History     CSN: [id number]  Arrival date & time 01/18/13  0013   First MD Initiated Contact with Patient 01/18/13 [phone]      Chief Complaint  Patient presents with  . Wheezing    (Consider location/radiation/quality/duration/timing/severity/associated sxs/prior treatment) HPI This is a 57 year old male with a history of asthma and chronic bronchitis. He states he develops breathing difficulty typically every spring. He is here with a four-day history of shortness of breath, wheezing and occasionally productive cough. The symptoms are moderate and worse when lying supine are exerting himself. He does not have an inhaler but has used an inhaler in the past. He has some upper back pain which she attributes to coughing; this is worse with movement, coughing or deep breathing. He denies fever or chills.    Past Medical History  Diagnosis Date  . DVT (deep venous thrombosis)     in legs and chest in past.  . Depressed affect   . Hypertension   . Diabetes mellitus without complication     Past Surgical History  Procedure Laterality Date  . Hip arthroplasty    . Appendectomy      No family history on file.  History  Substance Use Topics  . Smoking status: Current Every Day Smoker -- 1.00 packs/day    Types: Cigarettes  . Smokeless tobacco: Not on file  . Alcohol Use: No      Review of Systems  All other systems reviewed and are negative.    Allergies  Review of patient's allergies indicates no known allergies.  Home Medications   Current Outpatient Rx  Name  Route  Sig  Dispense  Refill  . buPROPion (WELLBUTRIN SR) 150 MG 12 hr tablet   Oral   Take 150 mg by mouth 2 (two) times daily.         Marland Kitchen escitalopram (LEXAPRO) 20 MG tablet   Oral   Take 20 mg by mouth daily.         Marland Kitchen glipiZIDE (GLUCOTROL) 10 MG tablet   Oral   Take 10 mg by mouth 2 (two) times daily before a meal.         . HYDROcodone-acetaminophen (NORCO) 5-325 MG per tablet   Oral   Take  2 tablets by mouth every 4 (four) hours as needed for pain.   20 tablet   0   . insulin aspart protamine-insulin aspart (NOVOLOG 70/30) (70-30) 100 UNIT/ML injection   Subcutaneous   Inject into the skin 2 (two) times daily with a meal.         . lisinopril (PRINIVIL,ZESTRIL) 20 MG tablet   Oral   Take 20 mg by mouth daily.         . metFORMIN (GLUCOPHAGE) 1000 MG tablet   Oral   Take 1,000 mg by mouth 1 day or 1 dose.         . warfarin (COUMADIN) 1 MG tablet   Oral   Take 25 mg by mouth daily.           BP 149/79  Pulse 97  Temp(Src) 98.4 F (36.9 C) (Oral)  Resp 20  Ht 5\' 8"  (1.727 m)  Wt 340 lb (154.223 kg)  BMI 51.71 kg/m2  SpO2 87%  Physical Exam General: Well-developed, obese male in no acute distress; appearance consistent with age of record HENT: normocephalic, atraumatic Eyes: pupils equal round and reactive to light; extraocular muscles intact Neck: supple Heart: regular rate and rhythm Lungs: Distant  sounds; rattly cough Abdomen: soft; obese; nontender Extremities: No deformity; full range of motion; +1 edema of lower legs Neurologic: Awake, alert and oriented; motor function intact in all extremities and symmetric; no facial droop Skin: Warm and dry Psychiatric: Normal mood and affect    ED Course  Procedures (including critical care time)     MDM  Nursing notes and vitals signs, including pulse oximetry, reviewed.  Summary of this visit's results, reviewed by myself:  Imaging Studies: Dg Chest 2 View  01/18/2013  *RADIOLOGY REPORT*  Clinical Data: Wheezing  CHEST - 2 VIEW  Comparison: 10/20/2012  Findings: Central bronchitic changes.  No peripheral consolidation. Normal heart size.  No pneumothorax and no pleural effusion.  IMPRESSION: Bronchitic changes.   Original Report Authenticated By: Jolaine Click, M.D.    1:05 AM Patient states breathing is improved after albuterol and Atrovent treatment. He states his primary care physician  usually treats these episodes with Zithromax and a steroid course.         Hanley Seamen, MD 01/18/13 [phone]

## 2013-02-24 ENCOUNTER — Emergency Department (HOSPITAL_BASED_OUTPATIENT_CLINIC_OR_DEPARTMENT_OTHER): Payer: Medicare Other

## 2013-02-24 ENCOUNTER — Emergency Department (HOSPITAL_BASED_OUTPATIENT_CLINIC_OR_DEPARTMENT_OTHER)
Admission: EM | Admit: 2013-02-24 | Discharge: 2013-02-24 | Discharge status: Against Medical Advice | Payer: Medicare Other | Attending: Emergency Medicine | Admitting: Emergency Medicine

## 2013-02-24 ENCOUNTER — Encounter (HOSPITAL_BASED_OUTPATIENT_CLINIC_OR_DEPARTMENT_OTHER): Payer: Self-pay | Admitting: *Deleted

## 2013-02-24 DIAGNOSIS — R109 Unspecified abdominal pain: Secondary | ICD-10-CM | POA: Insufficient documentation

## 2013-02-24 DIAGNOSIS — F17200 Nicotine dependence, unspecified, uncomplicated: Secondary | ICD-10-CM | POA: Insufficient documentation

## 2013-02-24 DIAGNOSIS — E119 Type 2 diabetes mellitus without complications: Secondary | ICD-10-CM | POA: Insufficient documentation

## 2013-02-24 DIAGNOSIS — I10 Essential (primary) hypertension: Secondary | ICD-10-CM | POA: Insufficient documentation

## 2013-02-24 HISTORY — DX: Pneumonia, unspecified organism: J18.9

## 2013-02-24 LAB — URINE MICROSCOPIC-ADD ON

## 2013-02-24 LAB — URINALYSIS, ROUTINE W REFLEX MICROSCOPIC
Nitrite: NEGATIVE
Specific Gravity, Urine: 1.027 (ref 1.005–1.030)
Urobilinogen, UA: 1 mg/dL (ref 0.0–1.0)
pH: 6 (ref 5.0–8.0)

## 2013-02-24 LAB — GLUCOSE, CAPILLARY: Glucose-Capillary: 422 mg/dL — ABNORMAL HIGH (ref 70–99)

## 2013-02-24 NOTE — ED Notes (Signed)
Pt called x1 to be roomed and no answer

## 2013-02-24 NOTE — ED Notes (Addendum)
Pt c/o right flank pain x 3 days. Denies injury or other s/s. Pt states he ran out of his Metformin but is taking girlfriends and also ran out of insulin today. Supposed to get some more tomorrow.

## 2013-02-26 ENCOUNTER — Emergency Department (HOSPITAL_BASED_OUTPATIENT_CLINIC_OR_DEPARTMENT_OTHER)
Admission: EM | Admit: 2013-02-26 | Discharge: 2013-02-26 | Disposition: A | Discharge status: Home / Self Care | Payer: Medicare Other | Attending: Emergency Medicine | Admitting: Emergency Medicine

## 2013-02-26 ENCOUNTER — Encounter (HOSPITAL_BASED_OUTPATIENT_CLINIC_OR_DEPARTMENT_OTHER): Payer: Self-pay | Admitting: *Deleted

## 2013-02-26 DIAGNOSIS — Z86718 Personal history of other venous thrombosis and embolism: Secondary | ICD-10-CM | POA: Insufficient documentation

## 2013-02-26 DIAGNOSIS — M545 Low back pain, unspecified: Secondary | ICD-10-CM | POA: Insufficient documentation

## 2013-02-26 DIAGNOSIS — Z79899 Other long term (current) drug therapy: Secondary | ICD-10-CM | POA: Insufficient documentation

## 2013-02-26 DIAGNOSIS — IMO0002 Reserved for concepts with insufficient information to code with codable children: Secondary | ICD-10-CM | POA: Insufficient documentation

## 2013-02-26 DIAGNOSIS — M549 Dorsalgia, unspecified: Secondary | ICD-10-CM

## 2013-02-26 DIAGNOSIS — I10 Essential (primary) hypertension: Secondary | ICD-10-CM | POA: Insufficient documentation

## 2013-02-26 DIAGNOSIS — Z8701 Personal history of pneumonia (recurrent): Secondary | ICD-10-CM | POA: Insufficient documentation

## 2013-02-26 DIAGNOSIS — F329 Major depressive disorder, single episode, unspecified: Secondary | ICD-10-CM | POA: Insufficient documentation

## 2013-02-26 DIAGNOSIS — R05 Cough: Secondary | ICD-10-CM | POA: Insufficient documentation

## 2013-02-26 DIAGNOSIS — F3289 Other specified depressive episodes: Secondary | ICD-10-CM | POA: Insufficient documentation

## 2013-02-26 DIAGNOSIS — E119 Type 2 diabetes mellitus without complications: Secondary | ICD-10-CM | POA: Insufficient documentation

## 2013-02-26 DIAGNOSIS — R059 Cough, unspecified: Secondary | ICD-10-CM | POA: Insufficient documentation

## 2013-02-26 DIAGNOSIS — F17200 Nicotine dependence, unspecified, uncomplicated: Secondary | ICD-10-CM | POA: Insufficient documentation

## 2013-02-26 LAB — URINALYSIS, ROUTINE W REFLEX MICROSCOPIC
Glucose, UA: NEGATIVE mg/dL
Hgb urine dipstick: NEGATIVE
Ketones, ur: NEGATIVE mg/dL
Protein, ur: NEGATIVE mg/dL
Urobilinogen, UA: 1 mg/dL (ref 0.0–1.0)

## 2013-02-26 MED ORDER — KETOROLAC TROMETHAMINE 60 MG/2ML IM SOLN
60.0000 mg | Freq: Once | INTRAMUSCULAR | Status: AC
  Administered 2013-02-26: 60 mg via INTRAMUSCULAR
  Filled 2013-02-26: qty 2

## 2013-02-26 MED ORDER — HYDROCODONE-ACETAMINOPHEN 5-325 MG PO TABS: 2.0000 | ORAL_TABLET | ORAL | Status: AC | PRN

## 2013-02-26 MED ORDER — HYDROCODONE-ACETAMINOPHEN 5-325 MG PO TABS
2.0000 | ORAL_TABLET | Freq: Once | ORAL | Status: AC
  Administered 2013-02-26: 2 via ORAL
  Filled 2013-02-26: qty 2

## 2013-02-26 NOTE — ED Notes (Signed)
Back pain x 3 days 

## 2013-02-26 NOTE — ED Provider Notes (Signed)
History     CSN: [id number]  Arrival date & time 02/26/13  1325   First MD Initiated Contact with Patient 02/26/13 1336      Chief Complaint  Patient presents with  . Back Pain    (Consider location/radiation/quality/duration/timing/severity/associated sxs/prior treatment) HPI Comments: Patient recently admitted at [facility] for pneumonia.  Has been coughing a lot.  He coughed three days ago and developed pain in the right flank.  It is worse with movement, palpation.  No productive cough.  No urinary complaints.  No fevers or chills.    Patient is a 57 y.o. male presenting with back pain. The history is provided by the patient.  Back Pain Location:  Lumbar spine Quality:  Shooting and stabbing Radiates to:  Does not radiate Pain severity:  Severe Pain is:  Same all the time Onset quality:  Sudden Timing:  Constant Progression:  Worsening   Past Medical History  Diagnosis Date  . DVT (deep venous thrombosis)     in legs and chest in past.  . Depressed affect   . Hypertension   . Diabetes mellitus without complication   . Pneumonia     Past Surgical History  Procedure Laterality Date  . Hip arthroplasty    . Appendectomy      No family history on file.  History  Substance Use Topics  . Smoking status: Current Every Day Smoker -- 1.00 packs/day    Types: Cigarettes  . Smokeless tobacco: Not on file  . Alcohol Use: No      Review of Systems  Musculoskeletal: Positive for back pain.  All other systems reviewed and are negative.    Allergies  Review of patient's allergies indicates no known allergies.  Home Medications   Current Outpatient Rx  Name  Route  Sig  Dispense  Refill  . azithromycin (ZITHROMAX) 250 MG tablet   Oral   Take 1 tablet (250 mg total) by mouth daily. Take first 2 tablets together, then 1 every day until finished.   6 tablet   0   . buPROPion (WELLBUTRIN SR) 150 MG 12 hr tablet   Oral   Take 150 mg by mouth 2 (two) times daily.         Marland Kitchen escitalopram (LEXAPRO) 20 MG tablet   Oral   Take 20 mg by mouth daily.         Marland Kitchen glipiZIDE (GLUCOTROL) 10 MG tablet   Oral   Take 10 mg by mouth 2 (two) times daily before a meal.         . HYDROcodone-acetaminophen (NORCO) 5-325 MG per tablet   Oral   Take 2 tablets by mouth every 4 (four) hours as needed for pain.   20 tablet   0   . HYDROcodone-homatropine (HYCODAN) 5-1.5 MG/5ML syrup   Oral   Take 5 mLs by mouth every 6 (six) hours as needed for cough.   120 mL   0   . insulin aspart protamine-insulin aspart (NOVOLOG 70/30) (70-30) 100 UNIT/ML injection   Subcutaneous   Inject into the skin 2 (two) times daily with a meal.         . lisinopril (PRINIVIL,ZESTRIL) 20 MG tablet   Oral   Take 20 mg by mouth daily.         . metFORMIN (GLUCOPHAGE) 1000 MG tablet   Oral   Take 1,000 mg by mouth 1 day or 1 dose.         . predniSONE (  DELTASONE) 20 MG tablet   Oral   Take 1 tablet (20 mg total) by mouth 2 (two) times daily.   14 tablet   0   . warfarin (COUMADIN) 1 MG tablet   Oral   Take 25 mg by mouth daily.           BP 101/60  Pulse 79  Temp(Src) 98.3 F (36.8 C) (Oral)  Resp 20  Wt 325 lb (147.419 kg)  BMI 49.43 kg/m2  SpO2 94%  Physical Exam  Nursing note and vitals reviewed. Constitutional: He is oriented to person, place, and time. He appears well-developed and well-nourished. No distress.  HENT:  Head: Normocephalic and atraumatic.  Mouth/Throat: Oropharynx is clear and moist.  Neck: Normal range of motion. Neck supple.  Cardiovascular: Normal rate and regular rhythm.   Pulmonary/Chest: Effort normal and breath sounds normal. No respiratory distress. He has no rales.  Abdominal: Soft. Bowel sounds are normal. He exhibits no distension. There is no tenderness.  Morbidly obese.  Musculoskeletal: Normal range of motion. He exhibits no edema.  Lymphadenopathy:    He has no cervical adenopathy.  Neurological: He is alert and  oriented to person, place, and time.  DTR's are 2+ and equal in the ble.  Strength is 5/5 in the ble.  Skin: Skin is warm and dry. He is not diaphoretic.    ED Course  Procedures (including critical care time)  Labs Reviewed  URINALYSIS, ROUTINE W REFLEX MICROSCOPIC   No results found.   No diagnosis found.    MDM  The patient presents with pain in the flank that started after forceful coughing.  The symptoms and exam are consistent with a musculoskeletal etiology.  The urine shows no evidence of infection or renal calculus.  Pain meds given, will discharge with same.        Geoffery Lyons, MD 02/26/13 [phone]

## 2013-03-16 ENCOUNTER — Emergency Department (HOSPITAL_BASED_OUTPATIENT_CLINIC_OR_DEPARTMENT_OTHER)
Admission: EM | Admit: 2013-03-16 | Discharge: 2013-03-17 | Disposition: A | Discharge status: Home / Self Care | Payer: Medicare Other | Attending: Emergency Medicine | Admitting: Emergency Medicine

## 2013-03-16 ENCOUNTER — Encounter (HOSPITAL_BASED_OUTPATIENT_CLINIC_OR_DEPARTMENT_OTHER): Payer: Self-pay | Admitting: *Deleted

## 2013-03-16 DIAGNOSIS — F3289 Other specified depressive episodes: Secondary | ICD-10-CM | POA: Insufficient documentation

## 2013-03-16 DIAGNOSIS — Z7901 Long term (current) use of anticoagulants: Secondary | ICD-10-CM | POA: Insufficient documentation

## 2013-03-16 DIAGNOSIS — F329 Major depressive disorder, single episode, unspecified: Secondary | ICD-10-CM | POA: Insufficient documentation

## 2013-03-16 DIAGNOSIS — R609 Edema, unspecified: Secondary | ICD-10-CM | POA: Insufficient documentation

## 2013-03-16 DIAGNOSIS — R05 Cough: Secondary | ICD-10-CM | POA: Insufficient documentation

## 2013-03-16 DIAGNOSIS — I10 Essential (primary) hypertension: Secondary | ICD-10-CM | POA: Insufficient documentation

## 2013-03-16 DIAGNOSIS — R0989 Other specified symptoms and signs involving the circulatory and respiratory systems: Secondary | ICD-10-CM | POA: Insufficient documentation

## 2013-03-16 DIAGNOSIS — R0609 Other forms of dyspnea: Secondary | ICD-10-CM | POA: Insufficient documentation

## 2013-03-16 DIAGNOSIS — E119 Type 2 diabetes mellitus without complications: Secondary | ICD-10-CM | POA: Insufficient documentation

## 2013-03-16 DIAGNOSIS — Z8701 Personal history of pneumonia (recurrent): Secondary | ICD-10-CM | POA: Insufficient documentation

## 2013-03-16 DIAGNOSIS — R0600 Dyspnea, unspecified: Secondary | ICD-10-CM

## 2013-03-16 DIAGNOSIS — Z86718 Personal history of other venous thrombosis and embolism: Secondary | ICD-10-CM | POA: Insufficient documentation

## 2013-03-16 DIAGNOSIS — Z79899 Other long term (current) drug therapy: Secondary | ICD-10-CM | POA: Insufficient documentation

## 2013-03-16 DIAGNOSIS — R059 Cough, unspecified: Secondary | ICD-10-CM | POA: Insufficient documentation

## 2013-03-16 DIAGNOSIS — R51 Headache: Secondary | ICD-10-CM | POA: Insufficient documentation

## 2013-03-16 DIAGNOSIS — F17200 Nicotine dependence, unspecified, uncomplicated: Secondary | ICD-10-CM | POA: Insufficient documentation

## 2013-03-16 DIAGNOSIS — Z794 Long term (current) use of insulin: Secondary | ICD-10-CM | POA: Insufficient documentation

## 2013-03-16 MED ORDER — ALBUTEROL SULFATE (5 MG/ML) 0.5% IN NEBU
5.0000 mg | INHALATION_SOLUTION | Freq: Once | RESPIRATORY_TRACT | Status: AC
  Administered 2013-03-16: 5 mg via RESPIRATORY_TRACT
  Filled 2013-03-16: qty 1

## 2013-03-16 NOTE — ED Notes (Signed)
C/o feeling sob that started yesterday. Hx of asthma and copd per pt. States he is out of his "steriod" pills and his inhalers are not helping. Denies any fevers. States he has had a dry cough. Slightly labored with exertion on exam.

## 2013-03-17 ENCOUNTER — Emergency Department (HOSPITAL_BASED_OUTPATIENT_CLINIC_OR_DEPARTMENT_OTHER): Payer: Medicare Other

## 2013-03-17 MED ORDER — ALBUTEROL SULFATE (5 MG/ML) 0.5% IN NEBU
5.0000 mg | INHALATION_SOLUTION | Freq: Once | RESPIRATORY_TRACT | Status: AC
  Administered 2013-03-17: 5 mg via RESPIRATORY_TRACT
  Filled 2013-03-17: qty 1

## 2013-03-17 MED ORDER — PREDNISONE 50 MG PO TABS
60.0000 mg | ORAL_TABLET | Freq: Once | ORAL | Status: AC
  Administered 2013-03-17: 60 mg via ORAL
  Filled 2013-03-17: qty 1

## 2013-03-17 MED ORDER — PREDNISONE 20 MG PO TABS: 20.0000 mg | ORAL_TABLET | Freq: Two times a day (BID) | ORAL | Status: AC

## 2013-03-17 MED ORDER — IPRATROPIUM BROMIDE 0.02 % IN SOLN
0.5000 mg | Freq: Once | RESPIRATORY_TRACT | Status: AC
  Administered 2013-03-17: 0.5 mg via RESPIRATORY_TRACT
  Filled 2013-03-17: qty 2.5

## 2013-03-17 NOTE — ED Notes (Signed)
Pt reports improvement after initial treatment

## 2013-03-17 NOTE — ED Provider Notes (Signed)
History  This chart was scribed for Hanley Seamen, MD by Ardelia Mems, ED Scribe. This patient was seen in room MH05/MH05 and the patient's care was started at 12:24 AM.   CSN: [id number]  Arrival date & time 03/16/13  2304     Chief Complaint  Patient presents with  . Shortness of Breath    HPI  HPI Comments: Raymond Kim is a 57 y.o. male who presents to the Emergency Department complaining of constant, moderate SOB that began yesterday. Pt's SOB is worse when supine and is not relieved by his at home albuterol. Pt states that he had a flare up of his asthmatic bronchitis yesterday. Pt states that he has an appointment in 6 days but has run out of prednisone. Pt also states that he also benefits from a nebulizer. Pt states that he has headaches and a dry cough. Pt denies chest pain and fever.    Past Medical History  Diagnosis Date  . DVT (deep venous thrombosis)     in legs and chest in past.  . Depressed affect   . Hypertension   . Diabetes mellitus without complication   . Pneumonia     Past Surgical History  Procedure Laterality Date  . Hip arthroplasty    . Appendectomy      No family history on file.  History  Substance Use Topics  . Smoking status: Current Every Day Smoker -- 1.00 packs/day    Types: Cigarettes  . Smokeless tobacco: Not on file  . Alcohol Use: No      Review of Systems  Constitutional: Negative for fever and chills.  Respiratory: Positive for cough and shortness of breath.   Cardiovascular: Negative for chest pain.  Gastrointestinal: Negative for nausea, vomiting and diarrhea.  Neurological: Positive for headaches.  All other systems reviewed and are negative.    Allergies  Phenergan  Home Medications   Current Outpatient Rx  Name  Route  Sig  Dispense  Refill  . azithromycin (ZITHROMAX) 250 MG tablet   Oral   Take 1 tablet (250 mg total) by mouth daily. Take first 2 tablets together, then 1 every day until finished.   6  tablet   0   . buPROPion (WELLBUTRIN SR) 150 MG 12 hr tablet   Oral   Take 150 mg by mouth 2 (two) times daily.         Marland Kitchen escitalopram (LEXAPRO) 20 MG tablet   Oral   Take 20 mg by mouth daily.         Marland Kitchen glipiZIDE (GLUCOTROL) 10 MG tablet   Oral   Take 10 mg by mouth 2 (two) times daily before a meal.         . HYDROcodone-acetaminophen (NORCO) 5-325 MG per tablet   Oral   Take 2 tablets by mouth every 4 (four) hours as needed for pain.   20 tablet   0   . HYDROcodone-acetaminophen (NORCO) 5-325 MG per tablet   Oral   Take 2 tablets by mouth every 4 (four) hours as needed for pain.   20 tablet   0   . HYDROcodone-homatropine (HYCODAN) 5-1.5 MG/5ML syrup   Oral   Take 5 mLs by mouth every 6 (six) hours as needed for cough.   120 mL   0   . insulin aspart protamine-insulin aspart (NOVOLOG 70/30) (70-30) 100 UNIT/ML injection   Subcutaneous   Inject into the skin 2 (two) times daily with a meal.         .  lisinopril (PRINIVIL,ZESTRIL) 20 MG tablet   Oral   Take 20 mg by mouth daily.         . metFORMIN (GLUCOPHAGE) 1000 MG tablet   Oral   Take 1,000 mg by mouth 1 day or 1 dose.         . predniSONE (DELTASONE) 20 MG tablet   Oral   Take 1 tablet (20 mg total) by mouth 2 (two) times daily.   14 tablet   0   . warfarin (COUMADIN) 1 MG tablet   Oral   Take 25 mg by mouth daily.           Triage Vitals: BP 145/60  Pulse 85  Temp(Src) 98.3 F (36.8 C)  Resp 20  Ht 5\' 8"  (1.727 m)  Wt 320 lb (145.151 kg)  BMI 48.67 kg/m2  SpO2 92%  Physical Exam  Nursing note and vitals reviewed. Constitutional: He is oriented to person, place, and time. He appears well-developed and well-nourished.  HENT:  Head: Normocephalic and atraumatic.  Eyes: EOM are normal. Pupils are equal, round, and reactive to light.  Neck: Normal range of motion. Neck supple. No tracheal deviation present.  Cardiovascular: Normal rate, regular rhythm and normal heart sounds.    Pulmonary/Chest: Effort normal and breath sounds normal. He has no wheezes.  Abdominal: Soft. Bowel sounds are normal. There is no tenderness.  Musculoskeletal: Normal range of motion. He exhibits no tenderness.  Neurological: He is alert and oriented to person, place, and time.  Skin: Skin is warm. No rash noted.  +1 edema of lower legs. Chronic appearing stasis changes of lower legs, right greater than left.  Psychiatric: He has a normal mood and affect.    ED Course  Procedures (including critical care time)  DIAGNOSTIC STUDIES: Oxygen Saturation is 92% on room air, slightly low by my interpretation but likely baseline for this patient.  COORDINATION OF CARE: 12:29 AM- Pt advised of plan for treatment and pt agrees.    MDM  Nursing notes and vitals signs, including pulse oximetry, reviewed.  Summary of this visit's results, reviewed by myself:  Imaging Studies: Dg Chest 2 View  03/17/2013   *RADIOLOGY REPORT*  Clinical Data: Shortness of breath.  Nonproductive cough.  COPD.  CHEST - 2 VIEW  Comparison:  None.  Findings:  The heart size and mediastinal contours are within normal limits.  Both lungs are clear.  Mild hyperinflation again noted.  The visualized skeletal structures are unremarkable.  IMPRESSION: COPD.  No active cardiopulmonary disease.   Original Report Authenticated By: Myles Rosenthal, M.D.              I personally performed the services described in this documentation, which was scribed in my presence.  The recorded information has been reviewed and is accurate.    Hanley Seamen, MD 03/17/13 [phone]

## 2013-05-09 ENCOUNTER — Emergency Department (HOSPITAL_BASED_OUTPATIENT_CLINIC_OR_DEPARTMENT_OTHER): Payer: Medicare Other

## 2013-05-09 ENCOUNTER — Encounter (HOSPITAL_BASED_OUTPATIENT_CLINIC_OR_DEPARTMENT_OTHER): Payer: Self-pay | Admitting: *Deleted

## 2013-05-09 ENCOUNTER — Emergency Department (HOSPITAL_BASED_OUTPATIENT_CLINIC_OR_DEPARTMENT_OTHER)
Admission: EM | Admit: 2013-05-09 | Discharge: 2013-05-09 | Disposition: A | Discharge status: Home / Self Care | Payer: Medicare Other | Attending: Emergency Medicine | Admitting: Emergency Medicine

## 2013-05-09 DIAGNOSIS — IMO0002 Reserved for concepts with insufficient information to code with codable children: Secondary | ICD-10-CM | POA: Insufficient documentation

## 2013-05-09 DIAGNOSIS — Z8701 Personal history of pneumonia (recurrent): Secondary | ICD-10-CM | POA: Insufficient documentation

## 2013-05-09 DIAGNOSIS — Z86718 Personal history of other venous thrombosis and embolism: Secondary | ICD-10-CM | POA: Insufficient documentation

## 2013-05-09 DIAGNOSIS — Z794 Long term (current) use of insulin: Secondary | ICD-10-CM | POA: Insufficient documentation

## 2013-05-09 DIAGNOSIS — F17200 Nicotine dependence, unspecified, uncomplicated: Secondary | ICD-10-CM | POA: Insufficient documentation

## 2013-05-09 DIAGNOSIS — E119 Type 2 diabetes mellitus without complications: Secondary | ICD-10-CM | POA: Insufficient documentation

## 2013-05-09 DIAGNOSIS — Y939 Activity, unspecified: Secondary | ICD-10-CM | POA: Insufficient documentation

## 2013-05-09 DIAGNOSIS — F329 Major depressive disorder, single episode, unspecified: Secondary | ICD-10-CM | POA: Insufficient documentation

## 2013-05-09 DIAGNOSIS — S43401A Unspecified sprain of right shoulder joint, initial encounter: Secondary | ICD-10-CM

## 2013-05-09 DIAGNOSIS — R296 Repeated falls: Secondary | ICD-10-CM | POA: Insufficient documentation

## 2013-05-09 DIAGNOSIS — S2249XA Multiple fractures of ribs, unspecified side, initial encounter for closed fracture: Secondary | ICD-10-CM | POA: Insufficient documentation

## 2013-05-09 DIAGNOSIS — S2231XA Fracture of one rib, right side, initial encounter for closed fracture: Secondary | ICD-10-CM

## 2013-05-09 DIAGNOSIS — I10 Essential (primary) hypertension: Secondary | ICD-10-CM | POA: Insufficient documentation

## 2013-05-09 DIAGNOSIS — Y9289 Other specified places as the place of occurrence of the external cause: Secondary | ICD-10-CM | POA: Insufficient documentation

## 2013-05-09 DIAGNOSIS — Z7901 Long term (current) use of anticoagulants: Secondary | ICD-10-CM | POA: Insufficient documentation

## 2013-05-09 DIAGNOSIS — Z79899 Other long term (current) drug therapy: Secondary | ICD-10-CM | POA: Insufficient documentation

## 2013-05-09 DIAGNOSIS — F3289 Other specified depressive episodes: Secondary | ICD-10-CM | POA: Insufficient documentation

## 2013-05-09 DIAGNOSIS — Z792 Long term (current) use of antibiotics: Secondary | ICD-10-CM | POA: Insufficient documentation

## 2013-05-09 MED ORDER — HYDROCODONE-ACETAMINOPHEN 5-325 MG PO TABS
1.0000 | ORAL_TABLET | Freq: Once | ORAL | Status: AC
  Administered 2013-05-09: 1 via ORAL
  Filled 2013-05-09: qty 1

## 2013-05-09 MED ORDER — OXYCODONE-ACETAMINOPHEN 5-325 MG PO TABS: 1.0000 | ORAL_TABLET | Freq: Four times a day (QID) | ORAL | Status: AC | PRN

## 2013-05-09 NOTE — ED Notes (Addendum)
Pt states that he fell one week ago off his porch (four feet) landed on his right side. Was seen at [facility] and had a broken left arm. States his right shoulder and right rib area continues to hurt. Has been taking tramadol for pain without relief. Pt has a hematoma to the right side of his abdomen. States normally a little sob. Denies any increase in sob.

## 2013-05-09 NOTE — ED Notes (Signed)
Pt not in room, daughter states was already taken for xrays.

## 2013-05-09 NOTE — ED Provider Notes (Signed)
  Medical screening examination/treatment/procedure(s) were performed by non-physician practitioner and as supervising physician I was immediately available for consultation/collaboration.   Gerhard Munch, MD 05/09/13 [phone]

## 2013-05-09 NOTE — ED Provider Notes (Signed)
History    CSN: [id number] Arrival date & time 05/09/13  1119  First MD Initiated Contact with Patient 05/09/13 1203     Chief Complaint  Patient presents with  . fall one week ago right shoulder pain/right rib pain    (Consider location/radiation/quality/duration/timing/severity/associated sxs/prior Treatment) HPI Comments: Patient presents with complaint of right shoulder and right lateral rib pain that began one week ago after a fall. Patient states that was initially seen at [facility] after falling from standing onto an outstretched left arm and landing on his right side and shoulder. Patient sustained a fracture of his left wrist but she has any cast. Patient states that he was prescribed tramadol which she has been taking without relief. He continues to have right shoulder pain and right lateral rib pain. Patient states that he had a CT scan of his abdomen and pelvis which did not demonstrate any bleeding at the time of the accident. Pain is worse with movement. Patient denies numbness or tingling of his extremities. He is having difficulty standing and sitting due to the pain. No difficulty with breathing. Onset of symptoms acute. Course is constant. Nothing makes symptoms better.  The history is provided by the patient.   Past Medical History  Diagnosis Date  . DVT (deep venous thrombosis)     in legs and chest in past.  . Depressed affect   . Hypertension   . Diabetes mellitus without complication   . Pneumonia    Past Surgical History  Procedure Laterality Date  . Hip arthroplasty    . Appendectomy     No family history on file. History  Substance Use Topics  . Smoking status: Current Every Day Smoker -- 1.00 packs/day    Types: Cigarettes  . Smokeless tobacco: Not on file  . Alcohol Use: No    Review of Systems  Constitutional: Negative for fever.  HENT: Negative for sore throat and rhinorrhea.   Eyes: Negative for redness.  Respiratory: Negative for  cough.   Cardiovascular: Positive for chest pain (rib pain) and leg swelling (bilateral, 'because I have to sleep sitting up').  Gastrointestinal: Negative for nausea, vomiting, abdominal pain and diarrhea.  Genitourinary: Negative for dysuria.  Musculoskeletal: Positive for arthralgias. Negative for myalgias.  Skin: Negative for rash.  Neurological: Negative for headaches.    Allergies  Phenergan  Home Medications   Current Outpatient Rx  Name  Route  Sig  Dispense  Refill  . azithromycin (ZITHROMAX) 250 MG tablet   Oral   Take 1 tablet (250 mg total) by mouth daily. Take first 2 tablets together, then 1 every day until finished.   6 tablet   0   . buPROPion (WELLBUTRIN SR) 150 MG 12 hr tablet   Oral   Take 150 mg by mouth 2 (two) times daily.         Marland Kitchen escitalopram (LEXAPRO) 20 MG tablet   Oral   Take 20 mg by mouth daily.         Marland Kitchen glipiZIDE (GLUCOTROL) 10 MG tablet   Oral   Take 10 mg by mouth 2 (two) times daily before a meal.         . HYDROcodone-acetaminophen (NORCO) 5-325 MG per tablet   Oral   Take 2 tablets by mouth every 4 (four) hours as needed for pain.   20 tablet   0   . HYDROcodone-acetaminophen (NORCO) 5-325 MG per tablet   Oral   Take 2 tablets  by mouth every 4 (four) hours as needed for pain.   20 tablet   0   . HYDROcodone-homatropine (HYCODAN) 5-1.5 MG/5ML syrup   Oral   Take 5 mLs by mouth every 6 (six) hours as needed for cough.   120 mL   0   . insulin aspart protamine-insulin aspart (NOVOLOG 70/30) (70-30) 100 UNIT/ML injection   Subcutaneous   Inject into the skin 2 (two) times daily with a meal.         . lisinopril (PRINIVIL,ZESTRIL) 20 MG tablet   Oral   Take 20 mg by mouth daily.         . metFORMIN (GLUCOPHAGE) 1000 MG tablet   Oral   Take 1,000 mg by mouth 1 day or 1 dose.         . predniSONE (DELTASONE) 20 MG tablet   Oral   Take 1 tablet (20 mg total) by mouth 2 (two) times daily.   14 tablet   0   .  predniSONE (DELTASONE) 20 MG tablet   Oral   Take 1 tablet (20 mg total) by mouth 2 (two) times daily.   14 tablet   0   . warfarin (COUMADIN) 1 MG tablet   Oral   Take 25 mg by mouth daily.          BP 145/67  Pulse 84  Resp 22  Ht 5\' 8"  (1.727 m)  Wt 320 lb (145.151 kg)  BMI 48.67 kg/m2  SpO2 95% Physical Exam  Nursing note and vitals reviewed. Constitutional: He appears well-developed and well-nourished.  HENT:  Head: Normocephalic and atraumatic.  Eyes: Conjunctivae are normal. Right eye exhibits no discharge. Left eye exhibits no discharge.  Neck: Normal range of motion. Neck supple.  Cardiovascular: Normal rate, regular rhythm and normal heart sounds.   Pulmonary/Chest: Effort normal and breath sounds normal. He exhibits tenderness and bony tenderness. He exhibits no laceration and no swelling.  Abdominal: Soft. There is no tenderness.    Musculoskeletal: He exhibits tenderness.       Right shoulder: He exhibits tenderness and bony tenderness. He exhibits normal range of motion, no swelling and no deformity.       Right elbow: Normal.      Cervical back: Normal.       Right upper arm: Normal.  Right shoulder: Tenderness superiorly and posteriorly, worse with movement, full range of motion.  Neurological: He is alert.  Skin: Skin is warm and dry.  Psychiatric: He has a normal mood and affect.    ED Course  Procedures (including critical care time) Labs Reviewed - No data to display Dg Chest 2 View  05/09/2013   *RADIOLOGY REPORT*  Clinical Data: Right-sided chest pain  CHEST - 2 VIEW  Comparison: 05/03/2013  Findings: The heart and pulmonary vascularity are within normal limits.  The lungs are well-aerated bilaterally without evidence of pneumothorax.  Multiple rib fractures are noted on the right involving at least the fourth fifth sixth and seventh ribs laterally.  A small right-sided pleural effusion is noted.  IMPRESSION: Multiple right-sided rib fractures  without pneumothorax.  A small effusion is noted.   Original Report Authenticated By: Alcide Clever, M.D.   Dg Shoulder Right  05/09/2013   *RADIOLOGY REPORT*  Clinical Data: Right shoulder pain  RIGHT SHOULDER - 2+ VIEW  Comparison: None.  Findings: No acute fracture or dislocation is noted.  Mild degenerative changes of the acromioclavicular joint are seen.  No soft tissue  abnormality is noted.  IMPRESSION: No acute abnormalities seen.   Original Report Authenticated By: Alcide Clever, M.D.   1. Rib fracture, right, closed, initial encounter   2. Shoulder sprain, right, initial encounter     12:44 PM Patient seen and examined. Work-up initiated. Medications ordered.   Vital signs reviewed and are as follows: Filed Vitals:   05/09/13 1129  BP: 145/67  Pulse: 84  Resp: 22   1:48 PM Pt d/w Dr. Jeraldine Loots. Patient informed of results.  Patient counseled on pain control. He will followup with his orthopedic doctor who is seeing him for his wrist.  Patient counseled on use of narcotic pain medications. Counseled not to combine these medications with others containing tylenol. Urged not to drink alcohol, drive, or perform any other activities that requires focus while taking these medications. The patient verbalizes understanding and agrees with the plan.  Patient counseled to take 10 deep breaths every hour to prevent pneumonia.  MDM  Patient with continued shoulder pain and rib pain after fall. X-rays demonstrates normal shoulder and rib fractures. No pneumothorax. Abdomen is soft. Pain control and conservative management indicated at this time. Patient has followup. He appears well, no respiratory distress.  Renne Crigler, PA-C 05/09/13 1350

## 2013-05-16 ENCOUNTER — Emergency Department (HOSPITAL_BASED_OUTPATIENT_CLINIC_OR_DEPARTMENT_OTHER): Payer: Medicare Other

## 2013-05-16 ENCOUNTER — Emergency Department (HOSPITAL_BASED_OUTPATIENT_CLINIC_OR_DEPARTMENT_OTHER)
Admission: EM | Admit: 2013-05-16 | Discharge: 2013-05-16 | Disposition: A | Discharge status: Home / Self Care | Payer: Medicare Other | Attending: Emergency Medicine | Admitting: Emergency Medicine

## 2013-05-16 ENCOUNTER — Encounter (HOSPITAL_BASED_OUTPATIENT_CLINIC_OR_DEPARTMENT_OTHER): Payer: Self-pay | Admitting: *Deleted

## 2013-05-16 DIAGNOSIS — I10 Essential (primary) hypertension: Secondary | ICD-10-CM | POA: Insufficient documentation

## 2013-05-16 DIAGNOSIS — Z8701 Personal history of pneumonia (recurrent): Secondary | ICD-10-CM | POA: Insufficient documentation

## 2013-05-16 DIAGNOSIS — Z86718 Personal history of other venous thrombosis and embolism: Secondary | ICD-10-CM | POA: Insufficient documentation

## 2013-05-16 DIAGNOSIS — M7989 Other specified soft tissue disorders: Secondary | ICD-10-CM

## 2013-05-16 DIAGNOSIS — Z794 Long term (current) use of insulin: Secondary | ICD-10-CM | POA: Insufficient documentation

## 2013-05-16 DIAGNOSIS — Z7901 Long term (current) use of anticoagulants: Secondary | ICD-10-CM | POA: Insufficient documentation

## 2013-05-16 DIAGNOSIS — E119 Type 2 diabetes mellitus without complications: Secondary | ICD-10-CM | POA: Insufficient documentation

## 2013-05-16 DIAGNOSIS — F3289 Other specified depressive episodes: Secondary | ICD-10-CM | POA: Insufficient documentation

## 2013-05-16 DIAGNOSIS — F329 Major depressive disorder, single episode, unspecified: Secondary | ICD-10-CM | POA: Insufficient documentation

## 2013-05-16 DIAGNOSIS — Z79899 Other long term (current) drug therapy: Secondary | ICD-10-CM | POA: Insufficient documentation

## 2013-05-16 DIAGNOSIS — IMO0002 Reserved for concepts with insufficient information to code with codable children: Secondary | ICD-10-CM | POA: Insufficient documentation

## 2013-05-16 DIAGNOSIS — R0789 Other chest pain: Secondary | ICD-10-CM | POA: Insufficient documentation

## 2013-05-16 DIAGNOSIS — F17200 Nicotine dependence, unspecified, uncomplicated: Secondary | ICD-10-CM | POA: Insufficient documentation

## 2013-05-16 DIAGNOSIS — Z792 Long term (current) use of antibiotics: Secondary | ICD-10-CM | POA: Insufficient documentation

## 2013-05-16 NOTE — ED Notes (Signed)
Right lower leg swelling and redness. He wants to make sure he does not have a blood clot. Hx of blood clots. He fell 2 weeks ago and broke his ribs and left arm. Denies pain.

## 2013-05-16 NOTE — ED Provider Notes (Addendum)
CSN: [id number]     Arrival date & time 05/16/13  1136 History     First MD Initiated Contact with Patient 05/16/13 1145     Chief Complaint  Patient presents with  . Leg Swelling   (Consider location/radiation/quality/duration/timing/severity/associated sxs/prior Treatment) HPI Comments: 57 year old male with a recent fall resulting in rib fractures and a wrist fracture presents with new swelling of his left leg. He states that since the injury he has been having to sleep in a chair sitting up and feels like his left leg may be swelling due to gravity. He denies any fevers chills weakness or numbness. He chronically has a swollen right lower extremity from a previous DVT. He used to be on Coumadin but was taken of last month by his PCP. He states that his left lower extremity always has some redness to it but the swelling in his calf is new over the past couple days. He has chest pain where his ribs hurt but states that this started before the leg swelling and has not changed since. Denies any shortness of breath.  The history is provided by the patient (and fiancee).    Past Medical History  Diagnosis Date  . DVT (deep venous thrombosis)     in legs and chest in past.  . Depressed affect   . Hypertension   . Diabetes mellitus without complication   . Pneumonia    Past Surgical History  Procedure Laterality Date  . Hip arthroplasty    . Appendectomy     No family history on file. History  Substance Use Topics  . Smoking status: Current Every Day Smoker -- 1.00 packs/day    Types: Cigarettes  . Smokeless tobacco: Not on file  . Alcohol Use: No    Review of Systems  Constitutional: Negative for fever and chills.  Respiratory: Negative for shortness of breath.   Cardiovascular: Positive for chest pain (since injuring his ribs a few days ago) and leg swelling.  Skin: Negative for color change (states his left leg is always red, not different today).  Neurological: Negative  for weakness and numbness.  All other systems reviewed and are negative.    Allergies  Phenergan  Home Medications   Current Outpatient Rx  Name  Route  Sig  Dispense  Refill  . azithromycin (ZITHROMAX) 250 MG tablet   Oral   Take 1 tablet (250 mg total) by mouth daily. Take first 2 tablets together, then 1 every day until finished.   6 tablet   0   . buPROPion (WELLBUTRIN SR) 150 MG 12 hr tablet   Oral   Take 150 mg by mouth 2 (two) times daily.         Marland Kitchen escitalopram (LEXAPRO) 20 MG tablet   Oral   Take 20 mg by mouth daily.         Marland Kitchen glipiZIDE (GLUCOTROL) 10 MG tablet   Oral   Take 10 mg by mouth 2 (two) times daily before a meal.         . HYDROcodone-acetaminophen (NORCO) 5-325 MG per tablet   Oral   Take 2 tablets by mouth every 4 (four) hours as needed for pain.   20 tablet   0   . HYDROcodone-acetaminophen (NORCO) 5-325 MG per tablet   Oral   Take 2 tablets by mouth every 4 (four) hours as needed for pain.   20 tablet   0   . HYDROcodone-homatropine (HYCODAN) 5-1.5 MG/5ML syrup  Oral   Take 5 mLs by mouth every 6 (six) hours as needed for cough.   120 mL   0   . insulin aspart protamine-insulin aspart (NOVOLOG 70/30) (70-30) 100 UNIT/ML injection   Subcutaneous   Inject into the skin 2 (two) times daily with a meal.         . lisinopril (PRINIVIL,ZESTRIL) 20 MG tablet   Oral   Take 20 mg by mouth daily.         . metFORMIN (GLUCOPHAGE) 1000 MG tablet   Oral   Take 1,000 mg by mouth 1 day or 1 dose.         . oxyCODONE-acetaminophen (PERCOCET/ROXICET) 5-325 MG per tablet   Oral   Take 1-2 tablets by mouth every 6 (six) hours as needed for pain.   20 tablet   0   . predniSONE (DELTASONE) 20 MG tablet   Oral   Take 1 tablet (20 mg total) by mouth 2 (two) times daily.   14 tablet   0   . predniSONE (DELTASONE) 20 MG tablet   Oral   Take 1 tablet (20 mg total) by mouth 2 (two) times daily.   14 tablet   0   . warfarin  (COUMADIN) 1 MG tablet   Oral   Take 25 mg by mouth daily.          BP 136/60  Pulse 92  Temp(Src) 98.6 F (37 C) (Oral)  Resp 20  SpO2 94% Physical Exam  Nursing note and vitals reviewed. Constitutional: He is oriented to person, place, and time. He appears well-developed and well-nourished.  Morbidly obese  HENT:  Head: Normocephalic and atraumatic.  Right Ear: External ear normal.  Left Ear: External ear normal.  Nose: Nose normal.  Eyes: Right eye exhibits no discharge. Left eye exhibits no discharge.  Neck: Neck supple.  Cardiovascular: Normal rate, regular rhythm, normal heart sounds and intact distal pulses.   Pulses:      Dorsalis pedis pulses are 2+ on the right side, and 2+ on the left side.  Pulmonary/Chest: Effort normal. He has no rales.  Abdominal: He exhibits no distension.  Musculoskeletal:       Right lower leg: He exhibits swelling (pitting edema - at baseline per patient) and edema. He exhibits no tenderness.       Left lower leg: He exhibits tenderness (mild), swelling and edema (mild pitting edema).  Neurological: He is alert and oriented to person, place, and time.  Skin: Skin is warm and dry.    ED Course   Procedures (including critical care time)  Labs Reviewed - No data to display US Venous Img Lower Unilateral Left  05/16/2013   *RADIOLOGY REPORT*  Clinical Data: left leg swelling, r/o DVT left leg swelling, r/o DVT;;  LEFT LOWER EXTREMITY VENOUS DUPLEX ULTRASOUND  Technique: Gray-scale sonography with compression, as well as color and duplex ultrasound, were performed to evaluate the deep venous system from the level of the common femoral vein through the popliteal and proximal calf veins.  Comparison: None  Findings:  Normal compressibility and normal Doppler signal within the common femoral, superficial femoral and popliteal veins, down to the proximal calf veins.  No grayscale filling defects to suggest DVT.  IMPRESSION: No evidence of left  lower extremity deep vein thrombosis.   Original Report Authenticated By: Charlett Nose, M.D.   1. Left leg swelling     MDM  Ultrasound negative for a DVT. Patient and fianc  states that his legs are his normal color as he usually has some amount of normal swelling. They have not noted any infectious symptoms. He is neurovascularly intact in both extremities. With a negative ultrasound the most likely etiology is from being in a dependent position especially when sleeping. It seems unlikely to be a cellulitis with only dependent swelling. No pain noted. He otherwise does not seem to be volume overloaded. At this time I will recommend that he start his TED hose as he was previously going to do starting tomorrow. He will followup with his PCP closely and will either go to his PCP or come back here if there are any changes.  Audree Camel, MD 05/16/13 1304  Audree Camel, MD 05/16/13 [phone]

## 2013-05-28 ENCOUNTER — Emergency Department (HOSPITAL_BASED_OUTPATIENT_CLINIC_OR_DEPARTMENT_OTHER)
Admission: EM | Admit: 2013-05-28 | Discharge: 2013-05-28 | Disposition: A | Discharge status: Home / Self Care | Payer: Medicare Other | Attending: Emergency Medicine | Admitting: Emergency Medicine

## 2013-05-28 ENCOUNTER — Emergency Department (HOSPITAL_BASED_OUTPATIENT_CLINIC_OR_DEPARTMENT_OTHER): Payer: Medicare Other

## 2013-05-28 ENCOUNTER — Encounter (HOSPITAL_BASED_OUTPATIENT_CLINIC_OR_DEPARTMENT_OTHER): Payer: Self-pay | Admitting: *Deleted

## 2013-05-28 DIAGNOSIS — F17200 Nicotine dependence, unspecified, uncomplicated: Secondary | ICD-10-CM | POA: Insufficient documentation

## 2013-05-28 DIAGNOSIS — Z794 Long term (current) use of insulin: Secondary | ICD-10-CM | POA: Insufficient documentation

## 2013-05-28 DIAGNOSIS — F3289 Other specified depressive episodes: Secondary | ICD-10-CM | POA: Insufficient documentation

## 2013-05-28 DIAGNOSIS — J4489 Other specified chronic obstructive pulmonary disease: Secondary | ICD-10-CM | POA: Insufficient documentation

## 2013-05-28 DIAGNOSIS — Z79899 Other long term (current) drug therapy: Secondary | ICD-10-CM | POA: Insufficient documentation

## 2013-05-28 DIAGNOSIS — S20219A Contusion of unspecified front wall of thorax, initial encounter: Secondary | ICD-10-CM | POA: Insufficient documentation

## 2013-05-28 DIAGNOSIS — Z8701 Personal history of pneumonia (recurrent): Secondary | ICD-10-CM | POA: Insufficient documentation

## 2013-05-28 DIAGNOSIS — I10 Essential (primary) hypertension: Secondary | ICD-10-CM | POA: Insufficient documentation

## 2013-05-28 DIAGNOSIS — F329 Major depressive disorder, single episode, unspecified: Secondary | ICD-10-CM | POA: Insufficient documentation

## 2013-05-28 DIAGNOSIS — Z86718 Personal history of other venous thrombosis and embolism: Secondary | ICD-10-CM | POA: Insufficient documentation

## 2013-05-28 DIAGNOSIS — R296 Repeated falls: Secondary | ICD-10-CM | POA: Insufficient documentation

## 2013-05-28 DIAGNOSIS — J449 Chronic obstructive pulmonary disease, unspecified: Secondary | ICD-10-CM | POA: Insufficient documentation

## 2013-05-28 DIAGNOSIS — Y9389 Activity, other specified: Secondary | ICD-10-CM | POA: Insufficient documentation

## 2013-05-28 DIAGNOSIS — S20212A Contusion of left front wall of thorax, initial encounter: Secondary | ICD-10-CM

## 2013-05-28 DIAGNOSIS — Y929 Unspecified place or not applicable: Secondary | ICD-10-CM | POA: Insufficient documentation

## 2013-05-28 HISTORY — DX: Chronic obstructive pulmonary disease, unspecified: J44.9

## 2013-05-28 MED ORDER — HYDROCODONE-ACETAMINOPHEN 5-325 MG PO TABS: 1.0000 | ORAL_TABLET | Freq: Four times a day (QID) | ORAL | Status: AC | PRN

## 2013-05-28 NOTE — ED Notes (Signed)
Patient states he sat down in a chair last night and fell, hitting a block wall.  C/O pain in his mid posterior back at the shoulder blade and his anterior left chest.  States he fell several weeks ago and has old rib fractures on the right and a left wrist fracture. Denies loc.

## 2013-05-28 NOTE — ED Provider Notes (Addendum)
CSN: [id number]     Arrival date & time 05/28/13  1308 History     First MD Initiated Contact with Patient 05/28/13 [phone]     Chief Complaint  Patient presents with  . Fall   (Consider location/radiation/quality/duration/timing/severity/associated sxs/prior Treatment) Patient is a 57 y.o. male presenting with fall. The history is provided by the patient.  Fall This is a new (went to sit down in his recliner in the middle of the night and missed falling into a wall and to teh floor.  since that time having left sided rib pain) problem. The current episode started yesterday. The problem occurs constantly. The problem has not changed since onset.Associated symptoms include chest pain. The symptoms are aggravated by bending, twisting and coughing (deep breathing). Nothing relieves the symptoms. Treatments tried: took pain meds he had from recent fall. The treatment provided moderate relief.    Past Medical History  Diagnosis Date  . DVT (deep venous thrombosis)     in legs and chest in past.  . Depressed affect   . Hypertension   . Diabetes mellitus without complication   . Pneumonia   . COPD (chronic obstructive pulmonary disease)    Past Surgical History  Procedure Laterality Date  . Hip arthroplasty    . Appendectomy     No family history on file. History  Substance Use Topics  . Smoking status: Current Every Day Smoker -- 0.50 packs/day    Types: Cigarettes  . Smokeless tobacco: Never Used  . Alcohol Use: No    Review of Systems  Respiratory: Negative for cough.        States he feels mildly sob  Cardiovascular: Positive for chest pain.  All other systems reviewed and are negative.    Allergies  Phenergan  Home Medications   Current Outpatient Rx  Name  Route  Sig  Dispense  Refill  . oxybutynin (OXYTROL) 3.9 MG/24HR   Transdermal   Place 1 patch onto the skin 2 (two) times a week.         Marland Kitchen azithromycin (ZITHROMAX) 250 MG tablet   Oral   Take 1 tablet  (250 mg total) by mouth daily. Take first 2 tablets together, then 1 every day until finished.   6 tablet   0   . buPROPion (WELLBUTRIN SR) 150 MG 12 hr tablet   Oral   Take 150 mg by mouth 2 (two) times daily.         Marland Kitchen escitalopram (LEXAPRO) 20 MG tablet   Oral   Take 20 mg by mouth daily.         Marland Kitchen glipiZIDE (GLUCOTROL) 10 MG tablet   Oral   Take 10 mg by mouth 2 (two) times daily before a meal.         . HYDROcodone-acetaminophen (NORCO) 5-325 MG per tablet   Oral   Take 2 tablets by mouth every 4 (four) hours as needed for pain.   20 tablet   0   . HYDROcodone-acetaminophen (NORCO) 5-325 MG per tablet   Oral   Take 2 tablets by mouth every 4 (four) hours as needed for pain.   20 tablet   0   . HYDROcodone-homatropine (HYCODAN) 5-1.5 MG/5ML syrup   Oral   Take 5 mLs by mouth every 6 (six) hours as needed for cough.   120 mL   0   . insulin aspart protamine-insulin aspart (NOVOLOG 70/30) (70-30) 100 UNIT/ML injection   Subcutaneous   Inject into  the skin 2 (two) times daily with a meal.         . lisinopril (PRINIVIL,ZESTRIL) 20 MG tablet   Oral   Take 20 mg by mouth daily.         . metFORMIN (GLUCOPHAGE) 1000 MG tablet   Oral   Take 1,000 mg by mouth 1 day or 1 dose.         . oxyCODONE-acetaminophen (PERCOCET/ROXICET) 5-325 MG per tablet   Oral   Take 1-2 tablets by mouth every 6 (six) hours as needed for pain.   20 tablet   0   . predniSONE (DELTASONE) 20 MG tablet   Oral   Take 1 tablet (20 mg total) by mouth 2 (two) times daily.   14 tablet   0   . predniSONE (DELTASONE) 20 MG tablet   Oral   Take 1 tablet (20 mg total) by mouth 2 (two) times daily.   14 tablet   0    BP 134/62  Temp(Src) 98.5 F (36.9 C) (Oral)  Resp 22  Ht 5\' 8"  (1.727 m)  Wt 320 lb (145.151 kg)  BMI 48.67 kg/m2  SpO2 100% Physical Exam  Nursing note and vitals reviewed. Constitutional: He is oriented to person, place, and time. He appears  well-developed and well-nourished. No distress.  Morbid obesity  HENT:  Head: Normocephalic and atraumatic.  Mouth/Throat: Oropharynx is clear and moist.  Eyes: Conjunctivae and EOM are normal. Pupils are equal, round, and reactive to light.  Neck: Normal range of motion. Neck supple.  Cardiovascular: Normal rate, regular rhythm and intact distal pulses.   No murmur heard. Pulmonary/Chest: Effort normal and breath sounds normal. No respiratory distress. He has no decreased breath sounds. He has no wheezes. He has no rales.   He exhibits tenderness.    Mild tenderness of the right ribs to palpation  Abdominal: Soft. He exhibits no distension. There is no tenderness. There is no rebound and no guarding.  Musculoskeletal: Normal range of motion. He exhibits no edema and no tenderness.       Cervical back: Normal.       Thoracic back: Normal.       Lumbar back: Normal.  Cast present on the left wrist.  Normal cap refill and movement of fingers.  Neurological: He is alert and oriented to person, place, and time.  Skin: Skin is warm and dry. No rash noted. No erythema.  Psychiatric: He has a normal mood and affect. His behavior is normal.    ED Course   Procedures (including critical care time)  Labs Reviewed - No data to display Dg Ribs Unilateral W/chest Left  05/28/2013   *RADIOLOGY REPORT*  Clinical Data: Fall  LEFT RIBS AND CHEST - 3+ VIEW  Comparison: 05/09/2013  Findings: Cardiac and mediastinal contours are normal.  Lungs are clear without infiltrate effusion or pneumothorax.  Multiple  right rib fractures unchanged.  No acute rib fracture on the left.  IMPRESSION: No acute abnormality.   Original Report Authenticated By: Janeece Riggers, M.D.   1. Chest wall contusion, left, initial encounter     MDM   Patient with a mechanical fall last night when he attempted to sit down in his recliner in the dark and missed falling against the wall hitting his back and left side.  Here today  c/o of pain in the left ribs with movement, palpation and deep breathing.  Sating 100% on RA but point tenderness on exam.  No back  pain on exam and N/V intact.  Normal bilateral breath sounds. Patient with a recent fall approximately 2 weeks ago off the porch resulting in right-sided rib fractures and a left distal wrist fracture.  Patient appears in mild pain but is in no distress. Left-sided rib films and chest x-ray pending.  9:18 AM Films neg for acute pathology.  Gwyneth Sprout, MD 05/28/13 4401  Gwyneth Sprout, MD 05/28/13 0272

## 2013-06-02 ENCOUNTER — Encounter (HOSPITAL_BASED_OUTPATIENT_CLINIC_OR_DEPARTMENT_OTHER): Payer: Self-pay

## 2013-06-02 ENCOUNTER — Emergency Department (HOSPITAL_BASED_OUTPATIENT_CLINIC_OR_DEPARTMENT_OTHER): Payer: Medicare Other

## 2013-06-02 ENCOUNTER — Encounter (HOSPITAL_BASED_OUTPATIENT_CLINIC_OR_DEPARTMENT_OTHER): Payer: Self-pay | Admitting: *Deleted

## 2013-06-02 ENCOUNTER — Emergency Department (HOSPITAL_BASED_OUTPATIENT_CLINIC_OR_DEPARTMENT_OTHER)
Admission: EM | Admit: 2013-06-02 | Discharge: 2013-06-02 | Discharge status: Against Medical Advice | Payer: Medicare Other | Source: Home / Self Care

## 2013-06-02 DIAGNOSIS — Z86718 Personal history of other venous thrombosis and embolism: Secondary | ICD-10-CM | POA: Insufficient documentation

## 2013-06-02 DIAGNOSIS — R071 Chest pain on breathing: Secondary | ICD-10-CM | POA: Insufficient documentation

## 2013-06-02 DIAGNOSIS — F329 Major depressive disorder, single episode, unspecified: Secondary | ICD-10-CM | POA: Insufficient documentation

## 2013-06-02 DIAGNOSIS — J449 Chronic obstructive pulmonary disease, unspecified: Secondary | ICD-10-CM | POA: Insufficient documentation

## 2013-06-02 DIAGNOSIS — Z8701 Personal history of pneumonia (recurrent): Secondary | ICD-10-CM | POA: Insufficient documentation

## 2013-06-02 DIAGNOSIS — E119 Type 2 diabetes mellitus without complications: Secondary | ICD-10-CM | POA: Insufficient documentation

## 2013-06-02 DIAGNOSIS — Z792 Long term (current) use of antibiotics: Secondary | ICD-10-CM | POA: Insufficient documentation

## 2013-06-02 DIAGNOSIS — R079 Chest pain, unspecified: Secondary | ICD-10-CM | POA: Insufficient documentation

## 2013-06-02 DIAGNOSIS — Z79899 Other long term (current) drug therapy: Secondary | ICD-10-CM | POA: Insufficient documentation

## 2013-06-02 DIAGNOSIS — Z794 Long term (current) use of insulin: Secondary | ICD-10-CM | POA: Insufficient documentation

## 2013-06-02 DIAGNOSIS — F3289 Other specified depressive episodes: Secondary | ICD-10-CM | POA: Insufficient documentation

## 2013-06-02 DIAGNOSIS — F17200 Nicotine dependence, unspecified, uncomplicated: Secondary | ICD-10-CM | POA: Insufficient documentation

## 2013-06-02 DIAGNOSIS — J4489 Other specified chronic obstructive pulmonary disease: Secondary | ICD-10-CM | POA: Insufficient documentation

## 2013-06-02 DIAGNOSIS — IMO0002 Reserved for concepts with insufficient information to code with codable children: Secondary | ICD-10-CM | POA: Insufficient documentation

## 2013-06-02 DIAGNOSIS — I10 Essential (primary) hypertension: Secondary | ICD-10-CM | POA: Insufficient documentation

## 2013-06-02 NOTE — ED Notes (Signed)
Patient here with left rib pain after missing chair when sitting down hitting wall, reports that this occurred last week.

## 2013-06-02 NOTE — ED Notes (Signed)
Pt not in room or waiting room.

## 2013-06-02 NOTE — ED Notes (Signed)
Pt not in room.

## 2013-06-02 NOTE — ED Notes (Signed)
Pt had a fall about 4 weeks ago breaking his left arm and has a cast in place also had rib fractures to right side as well. States he fell again about 1.5 ago hurting his left ribs. Was seen here  and told he had bruising to left ribs. Has not had any other fall since then.  States he is here to be reevaluated for left rib cage pain. States right side rib pain is much better. States he is normally sob but a little worse than normal. resp even and unlabored at present.

## 2013-06-03 ENCOUNTER — Emergency Department (HOSPITAL_BASED_OUTPATIENT_CLINIC_OR_DEPARTMENT_OTHER): Payer: Medicare Other

## 2013-06-03 ENCOUNTER — Emergency Department (HOSPITAL_BASED_OUTPATIENT_CLINIC_OR_DEPARTMENT_OTHER)
Admission: EM | Admit: 2013-06-03 | Discharge: 2013-06-03 | Disposition: A | Discharge status: Home / Self Care | Payer: Medicare Other | Attending: Emergency Medicine | Admitting: Emergency Medicine

## 2013-06-03 DIAGNOSIS — R0789 Other chest pain: Secondary | ICD-10-CM

## 2013-06-03 MED ORDER — HYDROCODONE-ACETAMINOPHEN 5-325 MG PO TABS
2.0000 | ORAL_TABLET | Freq: Once | ORAL | Status: AC
  Administered 2013-06-03: 2 via ORAL
  Filled 2013-06-03: qty 2

## 2013-06-03 MED ORDER — HYDROCODONE-ACETAMINOPHEN 5-325 MG PO TABS: 2.0000 | ORAL_TABLET | ORAL | Status: AC | PRN

## 2013-06-03 NOTE — ED Notes (Signed)
Pt back from x-ray.

## 2013-06-03 NOTE — ED Notes (Signed)
MD at bedside. 

## 2013-06-03 NOTE — ED Notes (Signed)
Patient transported to X-ray via wheelchair per tech. 

## 2013-06-03 NOTE — ED Provider Notes (Signed)
CSN: [id number]     Arrival date & time 06/02/13  2331 History  This chart was scribed for Geoffery Lyons, MD by Ardelia Mems, ED Scribe. This patient was seen in room MH04/MH04 and the patient's care was started at 12:39 AM.    Chief Complaint  Patient presents with  . Rib Injury    The history is provided by the patient. No language interpreter was used.    HPI Comments: Raymond Kim is a 57 y.o. male who presents to the Emergency Department complaining of constant, left-sided rib pain over the past 4 weeks. Pt states that about 4 weeks ago he fell backwards off of a 4 foot porch, when his new puppy ran between his legs, and he fractured his left wrist injured his ribs on the left side. Pt states that he had another accidental fall that occurred about 1 week ago, when he attempted to sit down in a chair, but missed the chair and hit the wall with his ribs on the left side. He states that he was seen for this fall in the here 1 week ago, and at [facility] and had seperate diagnoses of fractured ribs on the left side and bruising of his ribs on the left side. He reports SOB since the fall, that exceeds his baseline SOB due to COPD. He denies chest pain, back pain, neck pain, fever, chills, nausea, vomiting or any other symptoms. Pt is a current every day smoker of 0.5 packs/day and he denies alcohol use.   Past Medical History  Diagnosis Date  . DVT (deep venous thrombosis)     in legs and chest in past.  . Depressed affect   . Hypertension   . Diabetes mellitus without complication   . Pneumonia   . COPD (chronic obstructive pulmonary disease)    Past Surgical History  Procedure Laterality Date  . Hip arthroplasty    . Appendectomy     No family history on file. History  Substance Use Topics  . Smoking status: Current Every Day Smoker -- 0.50 packs/day    Types: Cigarettes  . Smokeless tobacco: Never Used  . Alcohol Use: No    Review of Systems A complete 10  system review of systems was obtained and all systems are negative except as noted in the HPI and PMH.   Allergies  Phenergan  Home Medications   Current Outpatient Rx  Name  Route  Sig  Dispense  Refill  . azithromycin (ZITHROMAX) 250 MG tablet   Oral   Take 1 tablet (250 mg total) by mouth daily. Take first 2 tablets together, then 1 every day until finished.   6 tablet   0   . buPROPion (WELLBUTRIN SR) 150 MG 12 hr tablet   Oral   Take 150 mg by mouth 2 (two) times daily.         Marland Kitchen escitalopram (LEXAPRO) 20 MG tablet   Oral   Take 20 mg by mouth daily.         Marland Kitchen glipiZIDE (GLUCOTROL) 10 MG tablet   Oral   Take 10 mg by mouth 2 (two) times daily before a meal.         . HYDROcodone-acetaminophen (NORCO) 5-325 MG per tablet   Oral   Take 2 tablets by mouth every 4 (four) hours as needed for pain.   20 tablet   0   . HYDROcodone-acetaminophen (NORCO) 5-325 MG per tablet   Oral   Take 2  tablets by mouth every 4 (four) hours as needed for pain.   20 tablet   0   . HYDROcodone-acetaminophen (NORCO/VICODIN) 5-325 MG per tablet   Oral   Take 1 tablet by mouth every 6 (six) hours as needed for pain.   20 tablet   0   . HYDROcodone-homatropine (HYCODAN) 5-1.5 MG/5ML syrup   Oral   Take 5 mLs by mouth every 6 (six) hours as needed for cough.   120 mL   0   . insulin aspart protamine-insulin aspart (NOVOLOG 70/30) (70-30) 100 UNIT/ML injection   Subcutaneous   Inject into the skin 2 (two) times daily with a meal.         . lisinopril (PRINIVIL,ZESTRIL) 20 MG tablet   Oral   Take 20 mg by mouth daily.         . metFORMIN (GLUCOPHAGE) 1000 MG tablet   Oral   Take 1,000 mg by mouth 1 day or 1 dose.         . oxybutynin (OXYTROL) 3.9 MG/24HR   Transdermal   Place 1 patch onto the skin 2 (two) times a week.         Marland Kitchen oxyCODONE-acetaminophen (PERCOCET/ROXICET) 5-325 MG per tablet   Oral   Take 1-2 tablets by mouth every 6 (six) hours as needed for  pain.   20 tablet   0   . predniSONE (DELTASONE) 20 MG tablet   Oral   Take 1 tablet (20 mg total) by mouth 2 (two) times daily.   14 tablet   0   . predniSONE (DELTASONE) 20 MG tablet   Oral   Take 1 tablet (20 mg total) by mouth 2 (two) times daily.   14 tablet   0    Triage Vitals: BP 165/74  Pulse 91  Temp(Src) 98.7 F (37.1 C) (Oral)  Resp 22  SpO2 93%  Physical Exam  Nursing note and vitals reviewed. Constitutional: He is oriented to person, place, and time. He appears well-developed and well-nourished.  HENT:  Head: Normocephalic.  Eyes: Conjunctivae and EOM are normal.  Neck: Normal range of motion. Neck supple. No tracheal deviation present.  Cardiovascular: Normal rate and regular rhythm.   Pulmonary/Chest: Effort normal and breath sounds normal. No respiratory distress.  Abdominal: Soft. Bowel sounds are normal. He exhibits no distension.  Musculoskeletal: Normal range of motion.  There is tenderness to palpation on the left chest wall. There is no crepitus and no bony abnormality.  Neurological: He is alert and oriented to person, place, and time.  Skin: Skin is warm and dry. No rash noted.  Psychiatric: He has a normal mood and affect.    ED Course   Medications  HYDROcodone-acetaminophen (NORCO/VICODIN) 5-325 MG per tablet 2 tablet (not administered)   Procedures (including critical care time)  DIAGNOSTIC STUDIES: Oxygen Saturation is 93% on RA, adequate by my interpretation.    COORDINATION OF CARE: 12:53 AM- Pt advised of plan for diagnostic radiology, along with plan to receive Norco in the ED and pt agrees.    Labs Reviewed - No data to display  No results found.  No diagnosis found.  MDM  Patient presents with continued pain in his left chest which is been experiencing since a fall about a week and a half ago. He's had no new trauma or injury. His physical exam is reassuring and x-rays do not reveal any new or missed fractures. There  is no pneumothorax. His pain will be treated  and the patient will be encouraged to followup with his primary care doctor if his pain persists in his situation does not improve.       I personally performed the services described in this documentation, which was scribed in my presence. The recorded information has been reviewed and is accurate.      Geoffery Lyons, MD 06/03/13 [phone]

## 2013-06-11 ENCOUNTER — Emergency Department (HOSPITAL_BASED_OUTPATIENT_CLINIC_OR_DEPARTMENT_OTHER)
Admission: EM | Admit: 2013-06-11 | Discharge: 2013-06-12 | Disposition: A | Discharge status: Home / Self Care | Payer: Medicare Other | Attending: Emergency Medicine | Admitting: Emergency Medicine

## 2013-06-11 ENCOUNTER — Encounter (HOSPITAL_BASED_OUTPATIENT_CLINIC_OR_DEPARTMENT_OTHER): Payer: Self-pay | Admitting: Emergency Medicine

## 2013-06-11 DIAGNOSIS — J4489 Other specified chronic obstructive pulmonary disease: Secondary | ICD-10-CM | POA: Insufficient documentation

## 2013-06-11 DIAGNOSIS — F3289 Other specified depressive episodes: Secondary | ICD-10-CM | POA: Insufficient documentation

## 2013-06-11 DIAGNOSIS — Z79899 Other long term (current) drug therapy: Secondary | ICD-10-CM | POA: Insufficient documentation

## 2013-06-11 DIAGNOSIS — F17200 Nicotine dependence, unspecified, uncomplicated: Secondary | ICD-10-CM | POA: Insufficient documentation

## 2013-06-11 DIAGNOSIS — Z86718 Personal history of other venous thrombosis and embolism: Secondary | ICD-10-CM | POA: Insufficient documentation

## 2013-06-11 DIAGNOSIS — E119 Type 2 diabetes mellitus without complications: Secondary | ICD-10-CM | POA: Insufficient documentation

## 2013-06-11 DIAGNOSIS — J449 Chronic obstructive pulmonary disease, unspecified: Secondary | ICD-10-CM | POA: Insufficient documentation

## 2013-06-11 DIAGNOSIS — IMO0002 Reserved for concepts with insufficient information to code with codable children: Secondary | ICD-10-CM | POA: Insufficient documentation

## 2013-06-11 DIAGNOSIS — Z4800 Encounter for change or removal of nonsurgical wound dressing: Secondary | ICD-10-CM | POA: Insufficient documentation

## 2013-06-11 DIAGNOSIS — I10 Essential (primary) hypertension: Secondary | ICD-10-CM | POA: Insufficient documentation

## 2013-06-11 DIAGNOSIS — F329 Major depressive disorder, single episode, unspecified: Secondary | ICD-10-CM | POA: Insufficient documentation

## 2013-06-11 DIAGNOSIS — Z5189 Encounter for other specified aftercare: Secondary | ICD-10-CM

## 2013-06-11 DIAGNOSIS — Z8701 Personal history of pneumonia (recurrent): Secondary | ICD-10-CM | POA: Insufficient documentation

## 2013-06-11 NOTE — ED Notes (Signed)
Pt c/o open wound to right lower leg x 3-4 days. Pt is diabetic

## 2013-06-12 MED ORDER — CEPHALEXIN 250 MG PO CAPS
500.0000 mg | ORAL_CAPSULE | Freq: Once | ORAL | Status: AC
  Administered 2013-06-12: 500 mg via ORAL
  Filled 2013-06-12: qty 2

## 2013-06-12 MED ORDER — MELOXICAM 7.5 MG PO TABS: 7.5000 mg | ORAL_TABLET | Freq: Every day | ORAL | Status: AC

## 2013-06-12 MED ORDER — DOXYCYCLINE HYCLATE 100 MG PO CAPS: 100.0000 mg | ORAL_CAPSULE | Freq: Two times a day (BID) | ORAL | Status: AC

## 2013-06-12 MED ORDER — OXYCODONE-ACETAMINOPHEN 5-325 MG PO TABS
2.0000 | ORAL_TABLET | Freq: Once | ORAL | Status: AC
  Administered 2013-06-12: 2 via ORAL
  Filled 2013-06-12 (×2): qty 2

## 2013-06-12 MED ORDER — DOXYCYCLINE HYCLATE 100 MG PO TABS
100.0000 mg | ORAL_TABLET | Freq: Once | ORAL | Status: AC
  Administered 2013-06-12: 100 mg via ORAL
  Filled 2013-06-12: qty 1

## 2013-06-12 MED ORDER — CEPHALEXIN 500 MG PO CAPS: 500.0000 mg | ORAL_CAPSULE | Freq: Four times a day (QID) | ORAL | Status: AC

## 2013-06-12 NOTE — ED Notes (Signed)
MD at bedside. 

## 2013-06-12 NOTE — ED Provider Notes (Signed)
CSN: [id number]     Arrival date & time 06/11/13  2204 History   First MD Initiated Contact with Patient 06/11/13 2354     Chief Complaint  Patient presents with  . Wound Check   (Consider location/radiation/quality/duration/timing/severity/associated sxs/prior Treatment) Patient is a 57 y.o. male presenting with wound check. The history is provided by the patient.  Wound Check This is a new problem. The current episode started more than 2 days ago. The problem occurs constantly. The problem has not changed since onset.Pertinent negatives include no chest pain, no abdominal pain, no headaches and no shortness of breath. Nothing aggravates the symptoms. Nothing relieves the symptoms. He has tried nothing for the symptoms. The treatment provided no relief.    Past Medical History  Diagnosis Date  . DVT (deep venous thrombosis)     in legs and chest in past.  . Depressed affect   . Hypertension   . Diabetes mellitus without complication   . Pneumonia   . COPD (chronic obstructive pulmonary disease)    Past Surgical History  Procedure Laterality Date  . Hip arthroplasty    . Appendectomy     No family history on file. History  Substance Use Topics  . Smoking status: Current Every Day Smoker -- 0.50 packs/day    Types: Cigarettes  . Smokeless tobacco: Never Used  . Alcohol Use: No    Review of Systems  Constitutional: Negative for fever.  Respiratory: Negative for shortness of breath.   Cardiovascular: Negative for chest pain.  Gastrointestinal: Negative for abdominal pain.  Neurological: Negative for headaches.  All other systems reviewed and are negative.    Allergies  Phenergan  Home Medications   Current Outpatient Rx  Name  Route  Sig  Dispense  Refill  . azithromycin (ZITHROMAX) 250 MG tablet   Oral   Take 1 tablet (250 mg total) by mouth daily. Take first 2 tablets together, then 1 every day until finished.   6 tablet   0   . buPROPion (WELLBUTRIN SR) 150  MG 12 hr tablet   Oral   Take 150 mg by mouth 2 (two) times daily.         Marland Kitchen escitalopram (LEXAPRO) 20 MG tablet   Oral   Take 20 mg by mouth daily.         Marland Kitchen glipiZIDE (GLUCOTROL) 10 MG tablet   Oral   Take 10 mg by mouth 2 (two) times daily before a meal.         . HYDROcodone-acetaminophen (NORCO) 5-325 MG per tablet   Oral   Take 2 tablets by mouth every 4 (four) hours as needed for pain.   20 tablet   0   . HYDROcodone-acetaminophen (NORCO) 5-325 MG per tablet   Oral   Take 2 tablets by mouth every 4 (four) hours as needed for pain.   20 tablet   0   . HYDROcodone-acetaminophen (NORCO) 5-325 MG per tablet   Oral   Take 2 tablets by mouth every 4 (four) hours as needed for pain.   20 tablet   0   . HYDROcodone-acetaminophen (NORCO/VICODIN) 5-325 MG per tablet   Oral   Take 1 tablet by mouth every 6 (six) hours as needed for pain.   20 tablet   0   . HYDROcodone-homatropine (HYCODAN) 5-1.5 MG/5ML syrup   Oral   Take 5 mLs by mouth every 6 (six) hours as needed for cough.   120 mL   0   .  insulin aspart protamine-insulin aspart (NOVOLOG 70/30) (70-30) 100 UNIT/ML injection   Subcutaneous   Inject into the skin 2 (two) times daily with a meal.         . lisinopril (PRINIVIL,ZESTRIL) 20 MG tablet   Oral   Take 20 mg by mouth daily.         . metFORMIN (GLUCOPHAGE) 1000 MG tablet   Oral   Take 1,000 mg by mouth 1 day or 1 dose.         . oxybutynin (OXYTROL) 3.9 MG/24HR   Transdermal   Place 1 patch onto the skin 2 (two) times a week.         Marland Kitchen oxyCODONE-acetaminophen (PERCOCET/ROXICET) 5-325 MG per tablet   Oral   Take 1-2 tablets by mouth every 6 (six) hours as needed for pain.   20 tablet   0   . predniSONE (DELTASONE) 20 MG tablet   Oral   Take 1 tablet (20 mg total) by mouth 2 (two) times daily.   14 tablet   0   . predniSONE (DELTASONE) 20 MG tablet   Oral   Take 1 tablet (20 mg total) by mouth 2 (two) times daily.   14  tablet   0    BP 124/66  Pulse 84  Temp(Src) 98.1 F (36.7 C) (Oral)  Resp 18  Wt 320 lb (145.151 kg)  BMI 48.67 kg/m2  SpO2 93% Physical Exam  Constitutional: He appears well-developed and well-nourished. No distress.  HENT:  Head: Normocephalic and atraumatic.  Eyes: Conjunctivae and EOM are normal.  Neck: Normal range of motion. Neck supple.  Cardiovascular: Normal rate, regular rhythm and intact distal pulses.   Pulmonary/Chest: Effort normal and breath sounds normal. He has no wheezes. He has no rales.  Abdominal: Soft. Bowel sounds are normal. There is no tenderness. There is no rebound and no guarding.  Skin: Skin is warm and dry.     Psychiatric: He has a normal mood and affect.    ED Course  Procedures (including critical care time) Labs Review Labs Reviewed - No data to display Imaging Review No results found.  MDM  Will treat with doxycycline and keflex and follow up within 2 days with your PMD return for fevers swelling streaking up the leg and any purulent drainage   Taleshia Luff K Felicitas Sine-Rasch, MD 06/12/13 1610

## 2013-06-20 ENCOUNTER — Emergency Department (HOSPITAL_BASED_OUTPATIENT_CLINIC_OR_DEPARTMENT_OTHER)
Admission: EM | Admit: 2013-06-20 | Discharge: 2013-06-20 | Disposition: A | Discharge status: Home / Self Care | Payer: Medicare Other | Attending: Emergency Medicine | Admitting: Emergency Medicine

## 2013-06-20 ENCOUNTER — Encounter (HOSPITAL_BASED_OUTPATIENT_CLINIC_OR_DEPARTMENT_OTHER): Payer: Self-pay | Admitting: Emergency Medicine

## 2013-06-20 DIAGNOSIS — IMO0002 Reserved for concepts with insufficient information to code with codable children: Secondary | ICD-10-CM | POA: Insufficient documentation

## 2013-06-20 DIAGNOSIS — Z794 Long term (current) use of insulin: Secondary | ICD-10-CM | POA: Insufficient documentation

## 2013-06-20 DIAGNOSIS — Z791 Long term (current) use of non-steroidal anti-inflammatories (NSAID): Secondary | ICD-10-CM | POA: Insufficient documentation

## 2013-06-20 DIAGNOSIS — E119 Type 2 diabetes mellitus without complications: Secondary | ICD-10-CM | POA: Insufficient documentation

## 2013-06-20 DIAGNOSIS — M25539 Pain in unspecified wrist: Secondary | ICD-10-CM | POA: Insufficient documentation

## 2013-06-20 DIAGNOSIS — J441 Chronic obstructive pulmonary disease with (acute) exacerbation: Secondary | ICD-10-CM | POA: Insufficient documentation

## 2013-06-20 DIAGNOSIS — Z8781 Personal history of (healed) traumatic fracture: Secondary | ICD-10-CM | POA: Insufficient documentation

## 2013-06-20 DIAGNOSIS — G8911 Acute pain due to trauma: Secondary | ICD-10-CM | POA: Insufficient documentation

## 2013-06-20 DIAGNOSIS — Z86718 Personal history of other venous thrombosis and embolism: Secondary | ICD-10-CM | POA: Insufficient documentation

## 2013-06-20 DIAGNOSIS — F17200 Nicotine dependence, unspecified, uncomplicated: Secondary | ICD-10-CM | POA: Insufficient documentation

## 2013-06-20 DIAGNOSIS — M25532 Pain in left wrist: Secondary | ICD-10-CM

## 2013-06-20 DIAGNOSIS — Z8701 Personal history of pneumonia (recurrent): Secondary | ICD-10-CM | POA: Insufficient documentation

## 2013-06-20 DIAGNOSIS — Z79899 Other long term (current) drug therapy: Secondary | ICD-10-CM | POA: Insufficient documentation

## 2013-06-20 DIAGNOSIS — F3289 Other specified depressive episodes: Secondary | ICD-10-CM | POA: Insufficient documentation

## 2013-06-20 DIAGNOSIS — F329 Major depressive disorder, single episode, unspecified: Secondary | ICD-10-CM | POA: Insufficient documentation

## 2013-06-20 DIAGNOSIS — Z792 Long term (current) use of antibiotics: Secondary | ICD-10-CM | POA: Insufficient documentation

## 2013-06-20 DIAGNOSIS — I10 Essential (primary) hypertension: Secondary | ICD-10-CM | POA: Insufficient documentation

## 2013-06-20 MED ORDER — ALBUTEROL SULFATE (5 MG/ML) 0.5% IN NEBU
5.0000 mg | INHALATION_SOLUTION | Freq: Once | RESPIRATORY_TRACT | Status: AC
  Administered 2013-06-20: 5 mg via RESPIRATORY_TRACT
  Filled 2013-06-20: qty 1

## 2013-06-20 MED ORDER — HYDROCODONE-ACETAMINOPHEN 5-325 MG PO TABS: 2.0000 | ORAL_TABLET | ORAL | Status: AC | PRN

## 2013-06-20 NOTE — ED Notes (Signed)
Pt having left wrist pain - status post left wrist fx about a month ago.  Sts he was given a splint after the cast was taken off but the splint is making his wrist hurt more.  Requesting a repeat xr.  Pt sts he got SOB while out and about about 30 min ago and does not have his albuterol with him.  Checked in triage by RT. Pt would also like to have a lesion on his left leg checked.

## 2013-06-20 NOTE — ED Provider Notes (Signed)
CSN: [id number]     Arrival date & time 06/20/13  1949 History   First MD Initiated Contact with Patient 06/20/13 2038     Chief Complaint  Patient presents with  . Wrist Pain  . Shortness of Breath   (Consider location/radiation/quality/duration/timing/severity/associated sxs/prior Treatment) HPI Comments: Patient presents for recheck of his left wrist. He states he fractured it about 2 months ago. He was in a cast and he had a followup appointment with his orthopedist about 2 weeks ago where they rex-rayed it and told him that it was healing fine. His orthopedist is in [facility]. He states he has some ongoing pain and just wanted to get his wrist rechecked. He denies any new injuries. He does have a history of COPD and when he came outside in the heat tonight to come here he started wheezing. He got a nebulizer treatment here in the ED and says he feels better and back to baseline. He denies any fevers or worsening cough.  Patient is a 57 y.o. male presenting with wrist pain and shortness of breath.  Wrist Pain Associated symptoms include shortness of breath. Pertinent negatives include no chest pain, no abdominal pain and no headaches.  Shortness of Breath Associated symptoms: wheezing   Associated symptoms: no abdominal pain, no chest pain, no cough, no diaphoresis, no fever, no headaches, no rash and no vomiting     Past Medical History  Diagnosis Date  . DVT (deep venous thrombosis)     in legs and chest in past.  . Depressed affect   . Hypertension   . Diabetes mellitus without complication   . Pneumonia   . COPD (chronic obstructive pulmonary disease)    Past Surgical History  Procedure Laterality Date  . Hip arthroplasty    . Appendectomy     No family history on file. History  Substance Use Topics  . Smoking status: Current Every Day Smoker -- 0.50 packs/day    Types: Cigarettes  . Smokeless tobacco: Never Used  . Alcohol Use: No    Review of Systems   Constitutional: Negative for fever, chills, diaphoresis and fatigue.  HENT: Negative for congestion, rhinorrhea and sneezing.   Eyes: Negative.   Respiratory: Positive for shortness of breath and wheezing. Negative for cough and chest tightness.   Cardiovascular: Negative for chest pain and leg swelling.  Gastrointestinal: Negative for nausea, vomiting, abdominal pain, diarrhea and blood in stool.  Genitourinary: Negative for frequency, hematuria, flank pain and difficulty urinating.  Musculoskeletal: Positive for arthralgias. Negative for back pain.  Skin: Negative for rash.  Neurological: Negative for dizziness, speech difficulty, weakness, numbness and headaches.    Allergies  Phenergan  Home Medications   Current Outpatient Rx  Name  Route  Sig  Dispense  Refill  . cephALEXin (KEFLEX) 500 MG capsule   Oral   Take 1 capsule (500 mg total) by mouth 4 (four) times daily.   28 capsule   0   . doxycycline (VIBRAMYCIN) 100 MG capsule   Oral   Take 1 capsule (100 mg total) by mouth 2 (two) times daily. One po bid x 7 days   14 capsule   0   . oxybutynin (DITROPAN-XL) 10 MG 24 hr tablet   Oral   Take 10 mg by mouth daily.         Marland Kitchen azithromycin (ZITHROMAX) 250 MG tablet   Oral   Take 1 tablet (250 mg total) by mouth daily. Take first 2 tablets together,  then 1 every day until finished.   6 tablet   0   . buPROPion (WELLBUTRIN SR) 150 MG 12 hr tablet   Oral   Take 150 mg by mouth 2 (two) times daily.         Marland Kitchen escitalopram (LEXAPRO) 20 MG tablet   Oral   Take 20 mg by mouth daily.         Marland Kitchen glipiZIDE (GLUCOTROL) 10 MG tablet   Oral   Take 10 mg by mouth 2 (two) times daily before a meal.         . HYDROcodone-acetaminophen (NORCO) 5-325 MG per tablet   Oral   Take 2 tablets by mouth every 4 (four) hours as needed for pain.   20 tablet   0   . HYDROcodone-acetaminophen (NORCO) 5-325 MG per tablet   Oral   Take 2 tablets by mouth every 4 (four) hours as  needed for pain.   20 tablet   0   . HYDROcodone-acetaminophen (NORCO) 5-325 MG per tablet   Oral   Take 2 tablets by mouth every 4 (four) hours as needed for pain.   20 tablet   0   . HYDROcodone-acetaminophen (NORCO/VICODIN) 5-325 MG per tablet   Oral   Take 1 tablet by mouth every 6 (six) hours as needed for pain.   20 tablet   0   . HYDROcodone-acetaminophen (NORCO/VICODIN) 5-325 MG per tablet   Oral   Take 2 tablets by mouth every 4 (four) hours as needed for pain.   6 tablet   0   . HYDROcodone-homatropine (HYCODAN) 5-1.5 MG/5ML syrup   Oral   Take 5 mLs by mouth every 6 (six) hours as needed for cough.   120 mL   0   . insulin aspart protamine-insulin aspart (NOVOLOG 70/30) (70-30) 100 UNIT/ML injection   Subcutaneous   Inject into the skin 2 (two) times daily with a meal.         . lisinopril (PRINIVIL,ZESTRIL) 20 MG tablet   Oral   Take 20 mg by mouth daily.         . meloxicam (MOBIC) 7.5 MG tablet   Oral   Take 1 tablet (7.5 mg total) by mouth daily.   7 tablet   0   . metFORMIN (GLUCOPHAGE) 1000 MG tablet   Oral   Take 1,000 mg by mouth 1 day or 1 dose.         . oxybutynin (OXYTROL) 3.9 MG/24HR   Transdermal   Place 1 patch onto the skin 2 (two) times a week.         Marland Kitchen oxyCODONE-acetaminophen (PERCOCET/ROXICET) 5-325 MG per tablet   Oral   Take 1-2 tablets by mouth every 6 (six) hours as needed for pain.   20 tablet   0   . predniSONE (DELTASONE) 20 MG tablet   Oral   Take 1 tablet (20 mg total) by mouth 2 (two) times daily.   14 tablet   0   . predniSONE (DELTASONE) 20 MG tablet   Oral   Take 1 tablet (20 mg total) by mouth 2 (two) times daily.   14 tablet   0    BP 162/81  Pulse 98  Temp(Src) 98.7 F (37.1 C) (Oral)  Resp 20  Ht 5\' 8"  (1.727 m)  Wt 320 lb (145.151 kg)  BMI 48.67 kg/m2  SpO2 94% Physical Exam  Constitutional: He is oriented to person, place, and time. He appears  well-developed and well-nourished.   HENT:  Head: Normocephalic and atraumatic.  Eyes: Pupils are equal, round, and reactive to light.  Neck: Normal range of motion. Neck supple.  Cardiovascular: Normal rate, regular rhythm and normal heart sounds.   Pulmonary/Chest: Effort normal. No respiratory distress. He has wheezes (mild expiratory bilateral wheezes). He has no rales. He exhibits no tenderness.  Abdominal: Soft. Bowel sounds are normal. There is no tenderness. There is no rebound and no guarding.  Musculoskeletal: Normal range of motion. He exhibits no edema.  There is mild diffuse tenderness around the left wrist. There's no swelling. He's neurovascularly intact.  Lymphadenopathy:    He has no cervical adenopathy.  Neurological: He is alert and oriented to person, place, and time.  Skin: Skin is warm and dry. No rash noted.  There is a small 1 cm wound to the anterior portion of his right lower leg. This has been treated with antibiotics. It appears to be well healing with no surrounding signs of infection  Psychiatric: He has a normal mood and affect.    ED Course  Procedures (including critical care time) Labs Review Labs Reviewed - No data to display Imaging Review No results found.  MDM   1. Wrist pain, left    Patient presents with ongoing pain to his left wrist after fracture. His has already followed up with an orthopedist and had repeat x-rays which she was told showed a well-healing fracture. He has a splint to use at home. He's had no new injuries. I advised him that there is no definite rex-ray negative this point. I encouraged him to followup with his orthopedist. He got a nebulizer treatment here in the ED for his wheezing and appears to be back to baseline.    Rolan Bucco, MD 06/20/13 2121

## 2013-06-29 ENCOUNTER — Emergency Department (HOSPITAL_BASED_OUTPATIENT_CLINIC_OR_DEPARTMENT_OTHER): Payer: Medicare Other

## 2013-06-29 ENCOUNTER — Emergency Department (HOSPITAL_BASED_OUTPATIENT_CLINIC_OR_DEPARTMENT_OTHER)
Admission: EM | Admit: 2013-06-29 | Discharge: 2013-06-29 | Disposition: A | Discharge status: Home / Self Care | Payer: Medicare Other | Attending: Emergency Medicine | Admitting: Emergency Medicine

## 2013-06-29 ENCOUNTER — Encounter (HOSPITAL_BASED_OUTPATIENT_CLINIC_OR_DEPARTMENT_OTHER): Payer: Self-pay

## 2013-06-29 DIAGNOSIS — S60212A Contusion of left wrist, initial encounter: Secondary | ICD-10-CM

## 2013-06-29 DIAGNOSIS — Y929 Unspecified place or not applicable: Secondary | ICD-10-CM | POA: Insufficient documentation

## 2013-06-29 DIAGNOSIS — S60219A Contusion of unspecified wrist, initial encounter: Secondary | ICD-10-CM | POA: Insufficient documentation

## 2013-06-29 DIAGNOSIS — F329 Major depressive disorder, single episode, unspecified: Secondary | ICD-10-CM | POA: Insufficient documentation

## 2013-06-29 DIAGNOSIS — E119 Type 2 diabetes mellitus without complications: Secondary | ICD-10-CM | POA: Insufficient documentation

## 2013-06-29 DIAGNOSIS — F17200 Nicotine dependence, unspecified, uncomplicated: Secondary | ICD-10-CM | POA: Insufficient documentation

## 2013-06-29 DIAGNOSIS — J4489 Other specified chronic obstructive pulmonary disease: Secondary | ICD-10-CM | POA: Insufficient documentation

## 2013-06-29 DIAGNOSIS — Z79899 Other long term (current) drug therapy: Secondary | ICD-10-CM | POA: Insufficient documentation

## 2013-06-29 DIAGNOSIS — J449 Chronic obstructive pulmonary disease, unspecified: Secondary | ICD-10-CM | POA: Insufficient documentation

## 2013-06-29 DIAGNOSIS — Z86718 Personal history of other venous thrombosis and embolism: Secondary | ICD-10-CM | POA: Insufficient documentation

## 2013-06-29 DIAGNOSIS — Y939 Activity, unspecified: Secondary | ICD-10-CM | POA: Insufficient documentation

## 2013-06-29 DIAGNOSIS — W1789XA Other fall from one level to another, initial encounter: Secondary | ICD-10-CM | POA: Insufficient documentation

## 2013-06-29 DIAGNOSIS — IMO0002 Reserved for concepts with insufficient information to code with codable children: Secondary | ICD-10-CM | POA: Insufficient documentation

## 2013-06-29 DIAGNOSIS — Z8701 Personal history of pneumonia (recurrent): Secondary | ICD-10-CM | POA: Insufficient documentation

## 2013-06-29 DIAGNOSIS — I10 Essential (primary) hypertension: Secondary | ICD-10-CM | POA: Insufficient documentation

## 2013-06-29 DIAGNOSIS — F3289 Other specified depressive episodes: Secondary | ICD-10-CM | POA: Insufficient documentation

## 2013-06-29 NOTE — ED Provider Notes (Signed)
CSN: [id number]     Arrival date & time 06/29/13  1847 History   First MD Initiated Contact with Patient 06/29/13 2018     Chief Complaint  Patient presents with  . Wrist Injury   (Consider location/radiation/quality/duration/timing/severity/associated sxs/prior Treatment) Patient is a 57 y.o. male presenting with wrist pain. The history is provided by the patient.  Wrist Pain This is a new problem. The current episode started today. The problem occurs constantly. The problem has been unchanged. Associated symptoms include myalgias. Pertinent negatives include no joint swelling. Nothing aggravates the symptoms. He has tried nothing for the symptoms. The treatment provided moderate relief.    Past Medical History  Diagnosis Date  . DVT (deep venous thrombosis)     in legs and chest in past.  . Depressed affect   . Hypertension   . Diabetes mellitus without complication   . Pneumonia   . COPD (chronic obstructive pulmonary disease)    Past Surgical History  Procedure Laterality Date  . Hip arthroplasty    . Appendectomy     No family history on file. History  Substance Use Topics  . Smoking status: Current Every Day Smoker -- 0.50 packs/day    Types: Cigarettes  . Smokeless tobacco: Never Used  . Alcohol Use: No    Review of Systems  Musculoskeletal: Positive for myalgias. Negative for joint swelling.  All other systems reviewed and are negative.    Allergies  Phenergan  Home Medications   Current Outpatient Rx  Name  Route  Sig  Dispense  Refill  . azithromycin (ZITHROMAX) 250 MG tablet   Oral   Take 1 tablet (250 mg total) by mouth daily. Take first 2 tablets together, then 1 every day until finished.   6 tablet   0   . buPROPion (WELLBUTRIN SR) 150 MG 12 hr tablet   Oral   Take 150 mg by mouth 2 (two) times daily.         . cephALEXin (KEFLEX) 500 MG capsule   Oral   Take 1 capsule (500 mg total) by mouth 4 (four) times daily.   28 capsule   0   .  doxycycline (VIBRAMYCIN) 100 MG capsule   Oral   Take 1 capsule (100 mg total) by mouth 2 (two) times daily. One po bid x 7 days   14 capsule   0   . escitalopram (LEXAPRO) 20 MG tablet   Oral   Take 20 mg by mouth daily.         Marland Kitchen glipiZIDE (GLUCOTROL) 10 MG tablet   Oral   Take 10 mg by mouth 2 (two) times daily before a meal.         . HYDROcodone-acetaminophen (NORCO) 5-325 MG per tablet   Oral   Take 2 tablets by mouth every 4 (four) hours as needed for pain.   20 tablet   0   . HYDROcodone-acetaminophen (NORCO) 5-325 MG per tablet   Oral   Take 2 tablets by mouth every 4 (four) hours as needed for pain.   20 tablet   0   . HYDROcodone-acetaminophen (NORCO) 5-325 MG per tablet   Oral   Take 2 tablets by mouth every 4 (four) hours as needed for pain.   20 tablet   0   . HYDROcodone-acetaminophen (NORCO/VICODIN) 5-325 MG per tablet   Oral   Take 1 tablet by mouth every 6 (six) hours as needed for pain.   20 tablet  0   . HYDROcodone-acetaminophen (NORCO/VICODIN) 5-325 MG per tablet   Oral   Take 2 tablets by mouth every 4 (four) hours as needed for pain.   6 tablet   0   . HYDROcodone-homatropine (HYCODAN) 5-1.5 MG/5ML syrup   Oral   Take 5 mLs by mouth every 6 (six) hours as needed for cough.   120 mL   0   . insulin aspart protamine-insulin aspart (NOVOLOG 70/30) (70-30) 100 UNIT/ML injection   Subcutaneous   Inject into the skin 2 (two) times daily with a meal.         . lisinopril (PRINIVIL,ZESTRIL) 20 MG tablet   Oral   Take 20 mg by mouth daily.         . meloxicam (MOBIC) 7.5 MG tablet   Oral   Take 1 tablet (7.5 mg total) by mouth daily.   7 tablet   0   . metFORMIN (GLUCOPHAGE) 1000 MG tablet   Oral   Take 1,000 mg by mouth 1 day or 1 dose.         . oxybutynin (DITROPAN-XL) 10 MG 24 hr tablet   Oral   Take 10 mg by mouth daily.         Marland Kitchen oxybutynin (OXYTROL) 3.9 MG/24HR   Transdermal   Place 1 patch onto the skin 2  (two) times a week.         Marland Kitchen oxyCODONE-acetaminophen (PERCOCET/ROXICET) 5-325 MG per tablet   Oral   Take 1-2 tablets by mouth every 6 (six) hours as needed for pain.   20 tablet   0   . predniSONE (DELTASONE) 20 MG tablet   Oral   Take 1 tablet (20 mg total) by mouth 2 (two) times daily.   14 tablet   0   . predniSONE (DELTASONE) 20 MG tablet   Oral   Take 1 tablet (20 mg total) by mouth 2 (two) times daily.   14 tablet   0    BP 156/90  Pulse 92  Temp(Src) 99 F (37.2 C) (Oral)  Resp 18  SpO2 96% Physical Exam  Nursing note and vitals reviewed. Constitutional: He is oriented to person, place, and time. He appears well-developed and well-nourished.  HENT:  Head: Normocephalic and atraumatic.  Musculoskeletal: He exhibits tenderness.  Left wrist  No bruising,  Tender distal hand and wrist,  nv and ns intact  Neurological: He is alert and oriented to person, place, and time. He has normal reflexes.  Skin: Skin is warm.  Psychiatric: He has a normal mood and affect.    ED Course  Procedures (including critical care time) Labs Review Labs Reviewed - No data to display Imaging Review Dg Wrist Complete Left  06/29/2013   *RADIOLOGY REPORT*  Clinical Data: Left wrist pain after fall.  LEFT WRIST - COMPLETE 3+ VIEW  Comparison: April 29, 2013.  Findings: Callus formation is now noted around the distal radial fracture seen on the prior exam.  Intra-articular extension is noted which was present on prior exam.  No new acute fracture is noted.  IMPRESSION: Healing distal radial fracture with intra-articular extension.   Original Report Authenticated By: Lupita Raider.,  M.D.   Pt placed in a velcro spint.   Pt advised tylenol and follow up with his Md for recheck MDM   1. Contusion of left wrist, initial encounter        Elson Areas, PA-C 06/29/13 2138  Lonia Skinner Lane, PA-C 06/29/13  2138  Elson Areas, PA-C 06/30/13 1236

## 2013-06-29 NOTE — ED Notes (Signed)
Patient here with left wrist pain after falling off porch with pain to left wrist. No obvious deformity

## 2013-06-30 NOTE — ED Provider Notes (Signed)
Medical screening examination/treatment/procedure(s) were conducted as a shared visit with non-physician practitioner(s) and myself.  I personally evaluated the patient during the encounter  I evaluated patient, has old L wrist fracture. Openly asking for narcotics. I believe he is pain-seeking, will not give narcotics. Wrist with normal ROM, no swelling or erythema.  Dagmar Hait, MD 06/30/13 [phone]

## 2013-07-09 ENCOUNTER — Emergency Department (HOSPITAL_BASED_OUTPATIENT_CLINIC_OR_DEPARTMENT_OTHER)
Admission: EM | Admit: 2013-07-09 | Discharge: 2013-07-09 | Disposition: A | Discharge status: Home / Self Care | Payer: Medicare Other | Attending: Emergency Medicine | Admitting: Emergency Medicine

## 2013-07-09 ENCOUNTER — Encounter (HOSPITAL_BASED_OUTPATIENT_CLINIC_OR_DEPARTMENT_OTHER): Payer: Self-pay | Admitting: *Deleted

## 2013-07-09 DIAGNOSIS — J449 Chronic obstructive pulmonary disease, unspecified: Secondary | ICD-10-CM | POA: Insufficient documentation

## 2013-07-09 DIAGNOSIS — G8929 Other chronic pain: Secondary | ICD-10-CM | POA: Insufficient documentation

## 2013-07-09 DIAGNOSIS — I10 Essential (primary) hypertension: Secondary | ICD-10-CM | POA: Insufficient documentation

## 2013-07-09 DIAGNOSIS — IMO0002 Reserved for concepts with insufficient information to code with codable children: Secondary | ICD-10-CM | POA: Insufficient documentation

## 2013-07-09 DIAGNOSIS — Z794 Long term (current) use of insulin: Secondary | ICD-10-CM | POA: Insufficient documentation

## 2013-07-09 DIAGNOSIS — Z86718 Personal history of other venous thrombosis and embolism: Secondary | ICD-10-CM | POA: Insufficient documentation

## 2013-07-09 DIAGNOSIS — E119 Type 2 diabetes mellitus without complications: Secondary | ICD-10-CM | POA: Insufficient documentation

## 2013-07-09 DIAGNOSIS — F3289 Other specified depressive episodes: Secondary | ICD-10-CM | POA: Insufficient documentation

## 2013-07-09 DIAGNOSIS — F17200 Nicotine dependence, unspecified, uncomplicated: Secondary | ICD-10-CM | POA: Insufficient documentation

## 2013-07-09 DIAGNOSIS — F329 Major depressive disorder, single episode, unspecified: Secondary | ICD-10-CM | POA: Insufficient documentation

## 2013-07-09 DIAGNOSIS — Z79899 Other long term (current) drug therapy: Secondary | ICD-10-CM | POA: Insufficient documentation

## 2013-07-09 DIAGNOSIS — Z87828 Personal history of other (healed) physical injury and trauma: Secondary | ICD-10-CM | POA: Insufficient documentation

## 2013-07-09 DIAGNOSIS — Z8701 Personal history of pneumonia (recurrent): Secondary | ICD-10-CM | POA: Insufficient documentation

## 2013-07-09 DIAGNOSIS — J4489 Other specified chronic obstructive pulmonary disease: Secondary | ICD-10-CM | POA: Insufficient documentation

## 2013-07-09 DIAGNOSIS — M25539 Pain in unspecified wrist: Secondary | ICD-10-CM | POA: Insufficient documentation

## 2013-07-09 MED ORDER — CYCLOBENZAPRINE HCL 10 MG PO TABS: 10.0000 mg | ORAL_TABLET | Freq: Two times a day (BID) | ORAL | Status: AC | PRN

## 2013-07-09 NOTE — ED Provider Notes (Signed)
CSN: [id number]     Arrival date & time 07/09/13  2124 History   First MD Initiated Contact with Patient 07/09/13 2132     Chief Complaint  Patient presents with  . Wrist Pain   (Consider location/radiation/quality/duration/timing/severity/associated sxs/prior Treatment) HPI  Lyncoln Ledgerwood is a 57 y.o. male complaining of pain to left wrist status post slip and fall in July. Rated a severe, 8/10, described as aching, exacerbated by movement and palpation. Patient states that the pain is poorly controlled with Motrin and Aleve. He denies any new injury to the wrist, redness warmth swelling, numbness paresthesia.   Past Medical History  Diagnosis Date  . DVT (deep venous thrombosis)     in legs and chest in past.  . Depressed affect   . Hypertension   . Diabetes mellitus without complication   . Pneumonia   . COPD (chronic obstructive pulmonary disease)    Past Surgical History  Procedure Laterality Date  . Hip arthroplasty    . Appendectomy     No family history on file. History  Substance Use Topics  . Smoking status: Current Every Day Smoker -- 0.50 packs/day    Types: Cigarettes  . Smokeless tobacco: Never Used  . Alcohol Use: No    Review of Systems 10 systems reviewed and found to be negative, except as noted in the HPI   Allergies  Phenergan  Home Medications   Current Outpatient Rx  Name  Route  Sig  Dispense  Refill  . buPROPion (WELLBUTRIN SR) 150 MG 12 hr tablet   Oral   Take 150 mg by mouth 2 (two) times daily.         Marland Kitchen escitalopram (LEXAPRO) 20 MG tablet   Oral   Take 20 mg by mouth daily.         Marland Kitchen glipiZIDE (GLUCOTROL) 10 MG tablet   Oral   Take 10 mg by mouth 2 (two) times daily before a meal.         . insulin aspart protamine-insulin aspart (NOVOLOG 70/30) (70-30) 100 UNIT/ML injection   Subcutaneous   Inject into the skin 2 (two) times daily with a meal.         . lisinopril (PRINIVIL,ZESTRIL) 20 MG tablet   Oral   Take 20 mg  by mouth daily.         . metFORMIN (GLUCOPHAGE) 1000 MG tablet   Oral   Take 1,000 mg by mouth 1 day or 1 dose.         . oxybutynin (DITROPAN-XL) 10 MG 24 hr tablet   Oral   Take 10 mg by mouth daily.         Marland Kitchen azithromycin (ZITHROMAX) 250 MG tablet   Oral   Take 1 tablet (250 mg total) by mouth daily. Take first 2 tablets together, then 1 every day until finished.   6 tablet   0   . cephALEXin (KEFLEX) 500 MG capsule   Oral   Take 1 capsule (500 mg total) by mouth 4 (four) times daily.   28 capsule   0   . doxycycline (VIBRAMYCIN) 100 MG capsule   Oral   Take 1 capsule (100 mg total) by mouth 2 (two) times daily. One po bid x 7 days   14 capsule   0   . HYDROcodone-acetaminophen (NORCO) 5-325 MG per tablet   Oral   Take 2 tablets by mouth every 4 (four) hours as needed for pain.   20  tablet   0   . HYDROcodone-acetaminophen (NORCO) 5-325 MG per tablet   Oral   Take 2 tablets by mouth every 4 (four) hours as needed for pain.   20 tablet   0   . HYDROcodone-acetaminophen (NORCO) 5-325 MG per tablet   Oral   Take 2 tablets by mouth every 4 (four) hours as needed for pain.   20 tablet   0   . HYDROcodone-acetaminophen (NORCO/VICODIN) 5-325 MG per tablet   Oral   Take 1 tablet by mouth every 6 (six) hours as needed for pain.   20 tablet   0   . HYDROcodone-acetaminophen (NORCO/VICODIN) 5-325 MG per tablet   Oral   Take 2 tablets by mouth every 4 (four) hours as needed for pain.   6 tablet   0   . HYDROcodone-homatropine (HYCODAN) 5-1.5 MG/5ML syrup   Oral   Take 5 mLs by mouth every 6 (six) hours as needed for cough.   120 mL   0   . meloxicam (MOBIC) 7.5 MG tablet   Oral   Take 1 tablet (7.5 mg total) by mouth daily.   7 tablet   0   . oxybutynin (OXYTROL) 3.9 MG/24HR   Transdermal   Place 1 patch onto the skin 2 (two) times a week.         Marland Kitchen oxyCODONE-acetaminophen (PERCOCET/ROXICET) 5-325 MG per tablet   Oral   Take 1-2 tablets by  mouth every 6 (six) hours as needed for pain.   20 tablet   0   . predniSONE (DELTASONE) 20 MG tablet   Oral   Take 1 tablet (20 mg total) by mouth 2 (two) times daily.   14 tablet   0   . predniSONE (DELTASONE) 20 MG tablet   Oral   Take 1 tablet (20 mg total) by mouth 2 (two) times daily.   14 tablet   0    BP 147/72  Pulse 90  Temp(Src) 98.4 F (36.9 C) (Oral)  Resp 20  Ht 5\' 8"  (1.727 m)  Wt 320 lb (145.151 kg)  BMI 48.67 kg/m2  SpO2 96% Physical Exam  Nursing note and vitals reviewed. Constitutional: He is oriented to person, place, and time. He appears well-developed and well-nourished. No distress.  HENT:  Head: Normocephalic.  Eyes: Conjunctivae and EOM are normal.  Cardiovascular: Normal rate.   Pulmonary/Chest: Effort normal. No stridor.  Musculoskeletal: Normal range of motion.  Sure shows no deformity, erythema swelling. Patient has full range of motion, he is diffusely tender to palpation on the dorsal and volar sides. Neurovascularly intact.  Neurological: He is alert and oriented to person, place, and time.  Psychiatric: He has a normal mood and affect.    ED Course  Procedures (including critical care time) Labs Review Labs Reviewed - No data to display Imaging Review No results found.  MDM  No diagnosis found.  Filed Vitals:   07/09/13 2136  BP: 147/72  Pulse: 90  Temp: 98.4 F (36.9 C)  TempSrc: Oral  Resp: 20  Height: 5\' 8"  (1.727 m)  Weight: 320 lb (145.151 kg)  SpO2: 96%     Elster Corbello is a 57 y.o. male with multiple visits to the ED for chronic pain status post slip and fall to the left wrist. Normal physical exam except for diffuse tenderness to palpation. I've explained to him that I will not be writing him any narcotic pain medications. He states that he has an appointment with  his orthopedist on Friday and he needs medication up until that time. I have explained to him that we only right for 24-48 hours of pain control  medications out of the ED. He will be given a prescription for Flexeril.  Pt is hemodynamically stable, appropriate for, and amenable to discharge at this time. Pt verbalized understanding and agrees with care plan. All questions answered. Outpatient follow-up and specific return precautions discussed.    Discharge Medication List as of 07/09/2013  9:43 PM    START taking these medications   Details  cyclobenzaprine (FLEXERIL) 10 MG tablet Take 1 tablet (10 mg total) by mouth 2 (two) times daily as needed for muscle spasms., Starting 07/09/2013, Until Discontinued, Print        Note: Portions of this report may have been transcribed using voice recognition software. Every effort was made to ensure accuracy; however, inadvertent computerized transcription errors may be present     Wynetta Emery, PA-C 07/09/13 2204

## 2013-07-09 NOTE — ED Provider Notes (Signed)
Medical screening examination/treatment/procedure(s) were performed by non-physician practitioner and as supervising physician I was immediately available for consultation/collaboration.   Audree Camel, MD 07/09/13 2300

## 2013-07-09 NOTE — ED Notes (Signed)
Pt reports he feel "couple months ago" and injured left wrist. Had cast and splint. C/o pain and swelling x 2 days

## 2013-09-21 ENCOUNTER — Emergency Department (HOSPITAL_BASED_OUTPATIENT_CLINIC_OR_DEPARTMENT_OTHER): Payer: Medicare Other

## 2013-09-21 ENCOUNTER — Encounter (HOSPITAL_BASED_OUTPATIENT_CLINIC_OR_DEPARTMENT_OTHER): Payer: Self-pay | Admitting: Emergency Medicine

## 2013-09-21 ENCOUNTER — Emergency Department (HOSPITAL_BASED_OUTPATIENT_CLINIC_OR_DEPARTMENT_OTHER)
Admission: EM | Admit: 2013-09-21 | Discharge: 2013-09-21 | Disposition: A | Discharge status: Home / Self Care | Payer: Medicare Other | Attending: Emergency Medicine | Admitting: Emergency Medicine

## 2013-09-21 DIAGNOSIS — IMO0002 Reserved for concepts with insufficient information to code with codable children: Secondary | ICD-10-CM | POA: Insufficient documentation

## 2013-09-21 DIAGNOSIS — Z8701 Personal history of pneumonia (recurrent): Secondary | ICD-10-CM | POA: Insufficient documentation

## 2013-09-21 DIAGNOSIS — Z79899 Other long term (current) drug therapy: Secondary | ICD-10-CM | POA: Insufficient documentation

## 2013-09-21 DIAGNOSIS — E119 Type 2 diabetes mellitus without complications: Secondary | ICD-10-CM | POA: Insufficient documentation

## 2013-09-21 DIAGNOSIS — F3289 Other specified depressive episodes: Secondary | ICD-10-CM | POA: Insufficient documentation

## 2013-09-21 DIAGNOSIS — F329 Major depressive disorder, single episode, unspecified: Secondary | ICD-10-CM | POA: Insufficient documentation

## 2013-09-21 DIAGNOSIS — Y929 Unspecified place or not applicable: Secondary | ICD-10-CM | POA: Insufficient documentation

## 2013-09-21 DIAGNOSIS — J449 Chronic obstructive pulmonary disease, unspecified: Secondary | ICD-10-CM | POA: Insufficient documentation

## 2013-09-21 DIAGNOSIS — J4489 Other specified chronic obstructive pulmonary disease: Secondary | ICD-10-CM | POA: Insufficient documentation

## 2013-09-21 DIAGNOSIS — S42291S Other displaced fracture of upper end of right humerus, sequela: Secondary | ICD-10-CM

## 2013-09-21 DIAGNOSIS — F17200 Nicotine dependence, unspecified, uncomplicated: Secondary | ICD-10-CM | POA: Insufficient documentation

## 2013-09-21 DIAGNOSIS — Z792 Long term (current) use of antibiotics: Secondary | ICD-10-CM | POA: Insufficient documentation

## 2013-09-21 DIAGNOSIS — Z96649 Presence of unspecified artificial hip joint: Secondary | ICD-10-CM | POA: Insufficient documentation

## 2013-09-21 DIAGNOSIS — I10 Essential (primary) hypertension: Secondary | ICD-10-CM | POA: Insufficient documentation

## 2013-09-21 DIAGNOSIS — Y939 Activity, unspecified: Secondary | ICD-10-CM | POA: Insufficient documentation

## 2013-09-21 DIAGNOSIS — Z791 Long term (current) use of non-steroidal anti-inflammatories (NSAID): Secondary | ICD-10-CM | POA: Insufficient documentation

## 2013-09-21 DIAGNOSIS — S42293A Other displaced fracture of upper end of unspecified humerus, initial encounter for closed fracture: Secondary | ICD-10-CM | POA: Insufficient documentation

## 2013-09-21 DIAGNOSIS — Z86718 Personal history of other venous thrombosis and embolism: Secondary | ICD-10-CM | POA: Insufficient documentation

## 2013-09-21 DIAGNOSIS — Z794 Long term (current) use of insulin: Secondary | ICD-10-CM | POA: Insufficient documentation

## 2013-09-21 DIAGNOSIS — W1809XA Striking against other object with subsequent fall, initial encounter: Secondary | ICD-10-CM | POA: Insufficient documentation

## 2013-09-21 HISTORY — DX: Presence of unspecified artificial hip joint: Z96.649

## 2013-09-21 MED ORDER — HYDROMORPHONE HCL PF 2 MG/ML IJ SOLN
2.0000 mg | Freq: Once | INTRAMUSCULAR | Status: AC
  Administered 2013-09-21: 2 mg via INTRAMUSCULAR
  Filled 2013-09-21: qty 1

## 2013-09-21 MED ORDER — OXYCODONE-ACETAMINOPHEN 5-325 MG PO TABS: 1.0000 | ORAL_TABLET | ORAL | Status: AC | PRN

## 2013-09-21 NOTE — ED Notes (Signed)
I fit and adjusted shoulder immobilizer to patient, XL size barely fit around patient waist. Patient states feels much better.

## 2013-09-21 NOTE — ED Notes (Signed)
Patient transported to X-ray 

## 2013-09-21 NOTE — ED Notes (Signed)
Pt sts he tripped and fell, hitting right upper arm, sts shoulder was broken a few months ago. Sts it is very painful to move, no numbness or tingling.

## 2013-09-21 NOTE — ED Provider Notes (Signed)
CSN: [id number]     Arrival date & time 09/21/13  0104 History   First MD Initiated Contact with Patient 09/21/13 0126     Chief Complaint  Patient presents with  . Shoulder Injury   (Consider location/radiation/quality/duration/timing/severity/associated sxs/prior Treatment) HPI This is a 57 year old male who fell in September and was diagnosed September 29 with an impacted minimally displaced fracture of the anatomic neck of the right humerus. He followed up with orthopedics and had been doing well with some ability to move his right shoulder. This morning about midnight he tripped over a 2 x 4 and struck his right shoulder on a concrete wall. He is now having severe pain in the right shoulder and is unable to move the right shoulder joint. There is no numbness or motor deficit distally. He denies other injury. He did have a headache earlier but that has resolved.   Past Medical History  Diagnosis Date  . DVT (deep venous thrombosis)     in legs and chest in past.  . Depressed affect   . Hypertension   . Diabetes mellitus without complication   . Pneumonia   . COPD (chronic obstructive pulmonary disease)   . History of hip replacement, total    Past Surgical History  Procedure Laterality Date  . Hip arthroplasty    . Appendectomy     History reviewed. No pertinent family history. History  Substance Use Topics  . Smoking status: Current Every Day Smoker -- 0.50 packs/day    Types: Cigarettes  . Smokeless tobacco: Current User  . Alcohol Use: No    Review of Systems  All other systems reviewed and are negative.  Chronic edema and stasis changes of lower legs.  Allergies  Codeine-chlorpheniramine-apap; Phenergan; and Ultram  Home Medications   Current Outpatient Rx  Name  Route  Sig  Dispense  Refill  . diazepam (VALIUM) 10 MG tablet   Oral   Take 10 mg by mouth every 6 (six) hours as needed for anxiety.         Marland Kitchen azithromycin (ZITHROMAX) 250 MG tablet   Oral  Take 1 tablet (250 mg total) by mouth daily. Take first 2 tablets together, then 1 every day until finished.   6 tablet   0   . buPROPion (WELLBUTRIN SR) 150 MG 12 hr tablet   Oral   Take 150 mg by mouth 2 (two) times daily.         . cephALEXin (KEFLEX) 500 MG capsule   Oral   Take 1 capsule (500 mg total) by mouth 4 (four) times daily.   28 capsule   0   . cyclobenzaprine (FLEXERIL) 10 MG tablet   Oral   Take 1 tablet (10 mg total) by mouth 2 (two) times daily as needed for muscle spasms.   15 tablet   0   . doxycycline (VIBRAMYCIN) 100 MG capsule   Oral   Take 1 capsule (100 mg total) by mouth 2 (two) times daily. One po bid x 7 days   14 capsule   0   . escitalopram (LEXAPRO) 20 MG tablet   Oral   Take 20 mg by mouth daily.         Marland Kitchen glipiZIDE (GLUCOTROL) 10 MG tablet   Oral   Take 10 mg by mouth 2 (two) times daily before a meal.         . HYDROcodone-acetaminophen (NORCO) 5-325 MG per tablet   Oral   Take 2  tablets by mouth every 4 (four) hours as needed for pain.   20 tablet   0   . HYDROcodone-acetaminophen (NORCO) 5-325 MG per tablet   Oral   Take 2 tablets by mouth every 4 (four) hours as needed for pain.   20 tablet   0   . HYDROcodone-acetaminophen (NORCO) 5-325 MG per tablet   Oral   Take 2 tablets by mouth every 4 (four) hours as needed for pain.   20 tablet   0   . HYDROcodone-acetaminophen (NORCO/VICODIN) 5-325 MG per tablet   Oral   Take 1 tablet by mouth every 6 (six) hours as needed for pain.   20 tablet   0   . HYDROcodone-acetaminophen (NORCO/VICODIN) 5-325 MG per tablet   Oral   Take 2 tablets by mouth every 4 (four) hours as needed for pain.   6 tablet   0   . HYDROcodone-homatropine (HYCODAN) 5-1.5 MG/5ML syrup   Oral   Take 5 mLs by mouth every 6 (six) hours as needed for cough.   120 mL   0   . insulin aspart protamine-insulin aspart (NOVOLOG 70/30) (70-30) 100 UNIT/ML injection   Subcutaneous   Inject into the  skin 2 (two) times daily with a meal.         . lisinopril (PRINIVIL,ZESTRIL) 20 MG tablet   Oral   Take 20 mg by mouth daily.         . meloxicam (MOBIC) 7.5 MG tablet   Oral   Take 1 tablet (7.5 mg total) by mouth daily.   7 tablet   0   . metFORMIN (GLUCOPHAGE) 1000 MG tablet   Oral   Take 1,000 mg by mouth 1 day or 1 dose.         . oxybutynin (DITROPAN-XL) 10 MG 24 hr tablet   Oral   Take 10 mg by mouth daily.         Marland Kitchen oxybutynin (OXYTROL) 3.9 MG/24HR   Transdermal   Place 1 patch onto the skin 2 (two) times a week.         Marland Kitchen oxyCODONE-acetaminophen (PERCOCET) 5-325 MG per tablet   Oral   Take 1-2 tablets by mouth every 4 (four) hours as needed (for pain).   30 tablet   0   . oxyCODONE-acetaminophen (PERCOCET/ROXICET) 5-325 MG per tablet   Oral   Take 1-2 tablets by mouth every 6 (six) hours as needed for pain.   20 tablet   0   . predniSONE (DELTASONE) 20 MG tablet   Oral   Take 1 tablet (20 mg total) by mouth 2 (two) times daily.   14 tablet   0   . predniSONE (DELTASONE) 20 MG tablet   Oral   Take 1 tablet (20 mg total) by mouth 2 (two) times daily.   14 tablet   0    BP 139/66  Pulse 96  Temp(Src) 97.6 F (36.4 C) (Oral)  Resp 20  Ht 5\' 8"  (1.727 m)  Wt 315 lb (142.883 kg)  BMI 47.91 kg/m2  SpO2 94%  Physical Exam General: Well-developed, well-nourished male in no acute distress; appearance consistent with age of record HENT: normocephalic; atraumatic Eyes: pupils equal, round and reactive to light; extraocular muscles intact Neck: supple; nontender  Heart: regular rate and rhythm;  distant sounds  Lungs: clear to auscultation bilaterally Abdomen: soft;  obese; nontender  Extremities: swelling and tenderness of right shoulder with very decreased range of motion and  severe pain on attempted range of motion of right shoulder, right upper extremity distally neurovascularly intact;  2+ edema of lower legs with chronic-appearing stasis  changes  Neurologic: Awake, alert and oriented; motor function intact in all extremities and symmetric; no facial droop Skin: Warm and dry Psychiatric: Normal mood and affect    ED Course  Procedures (including critical care time)  MDM  Nursing notes and vitals signs, including pulse oximetry, reviewed.  Summary of this visit's results, reviewed by myself:  Labs:  No results found for this or any previous visit (from the past 24 hour(s)).  Imaging Studies: Dg Shoulder Right  09/21/2013   CLINICAL DATA:  Tripped over 2x4; right shoulder pain. Recent prior right shoulder fracture.  EXAM: RIGHT SHOULDER - 2+ VIEW  COMPARISON:  Right shoulder radiographs performed 07/13/2013  FINDINGS: Since the prior study, there has been partial resorption at the site of right humeral neck fracture, with significantly worsening impaction involving the right humeral head. An associated fracture line extends through the right humeral head. The right humeral head remains seated at the glenoid fossa. The right acromioclavicular joint is unremarkable in appearance. A right shoulder joint effusion is suspected. The visualized portions the right lung are clear.  IMPRESSION: Interval partial resorption at the site of prior right humeral neck fracture, with significantly worsening impaction involving the right humeral head. Associated fracture line extends through the right humeral head, likely chronic in nature.   Electronically Signed   By: Roanna Raider M.D.   On: 09/21/2013 02:11   Dg Humerus Right  09/21/2013   CLINICAL DATA:  History of broken shoulder. Painful to move the right arm.  EXAM: RIGHT HUMERUS - 2+ VIEW  COMPARISON:  07/13/2013  FINDINGS: Again noted is a comminuted proximal right humerus fracture. There appears to be increased impaction of the proximal humeral fracture with some callus formation. There is some bone resorption at the head and neck junction. The mid and distal humerus are intact.   IMPRESSION: Comminuted fracture of the proximal right humerus.  No acute bone abnormality to the mid and distal right humerus.   Electronically Signed   By: Richarda Overlie M.D.   On: 09/21/2013 02:49   2:39 AM The patient has an orthopedist with whom we will followup on Monday. He was advised that surgical intervention may be required.     Hanley Seamen, MD 09/21/13 [phone]

## 2013-09-21 NOTE — ED Notes (Signed)
Pt states tripped over a 2X4  Hit left shoulder on concrete wall,  States has pain to rt shoulder and back of arm

## 2013-09-24 ENCOUNTER — Encounter (HOSPITAL_BASED_OUTPATIENT_CLINIC_OR_DEPARTMENT_OTHER): Payer: Self-pay | Admitting: Emergency Medicine

## 2013-09-24 ENCOUNTER — Emergency Department (HOSPITAL_BASED_OUTPATIENT_CLINIC_OR_DEPARTMENT_OTHER)
Admission: EM | Admit: 2013-09-24 | Discharge: 2013-09-24 | Disposition: A | Discharge status: Home / Self Care | Payer: Medicare Other | Attending: Emergency Medicine | Admitting: Emergency Medicine

## 2013-09-24 DIAGNOSIS — IMO0002 Reserved for concepts with insufficient information to code with codable children: Secondary | ICD-10-CM | POA: Insufficient documentation

## 2013-09-24 DIAGNOSIS — F3289 Other specified depressive episodes: Secondary | ICD-10-CM | POA: Insufficient documentation

## 2013-09-24 DIAGNOSIS — Z791 Long term (current) use of non-steroidal anti-inflammatories (NSAID): Secondary | ICD-10-CM | POA: Insufficient documentation

## 2013-09-24 DIAGNOSIS — IMO0001 Reserved for inherently not codable concepts without codable children: Secondary | ICD-10-CM | POA: Insufficient documentation

## 2013-09-24 DIAGNOSIS — J4489 Other specified chronic obstructive pulmonary disease: Secondary | ICD-10-CM | POA: Insufficient documentation

## 2013-09-24 DIAGNOSIS — Z792 Long term (current) use of antibiotics: Secondary | ICD-10-CM | POA: Insufficient documentation

## 2013-09-24 DIAGNOSIS — Z86718 Personal history of other venous thrombosis and embolism: Secondary | ICD-10-CM | POA: Insufficient documentation

## 2013-09-24 DIAGNOSIS — Z79899 Other long term (current) drug therapy: Secondary | ICD-10-CM | POA: Insufficient documentation

## 2013-09-24 DIAGNOSIS — S42291G Other displaced fracture of upper end of right humerus, subsequent encounter for fracture with delayed healing: Secondary | ICD-10-CM

## 2013-09-24 DIAGNOSIS — F329 Major depressive disorder, single episode, unspecified: Secondary | ICD-10-CM | POA: Insufficient documentation

## 2013-09-24 DIAGNOSIS — Z794 Long term (current) use of insulin: Secondary | ICD-10-CM | POA: Insufficient documentation

## 2013-09-24 DIAGNOSIS — Z96649 Presence of unspecified artificial hip joint: Secondary | ICD-10-CM | POA: Insufficient documentation

## 2013-09-24 DIAGNOSIS — I10 Essential (primary) hypertension: Secondary | ICD-10-CM | POA: Insufficient documentation

## 2013-09-24 DIAGNOSIS — Z8701 Personal history of pneumonia (recurrent): Secondary | ICD-10-CM | POA: Insufficient documentation

## 2013-09-24 DIAGNOSIS — F17200 Nicotine dependence, unspecified, uncomplicated: Secondary | ICD-10-CM | POA: Insufficient documentation

## 2013-09-24 DIAGNOSIS — J449 Chronic obstructive pulmonary disease, unspecified: Secondary | ICD-10-CM | POA: Insufficient documentation

## 2013-09-24 DIAGNOSIS — E119 Type 2 diabetes mellitus without complications: Secondary | ICD-10-CM | POA: Insufficient documentation

## 2013-09-24 MED ORDER — OXYCODONE-ACETAMINOPHEN 5-325 MG PO TABS: 1.0000 | ORAL_TABLET | ORAL | Status: DC | PRN

## 2013-09-24 NOTE — ED Notes (Signed)
States he needs a refill on his Percocet he was given on Friday for right shoulder pain.

## 2013-09-24 NOTE — ED Provider Notes (Signed)
CSN: [id number]     Arrival date & time 09/24/13  1424 History   First MD Initiated Contact with Patient 09/24/13 1431     Chief Complaint  Patient presents with  . Medication Refill   (Consider location/radiation/quality/duration/timing/severity/associated sxs/prior Treatment) HPI Comments: Pt states that he has a humeral head fracture and he is out of his pain medication:pt states that he isn't wearing his sling  The history is provided by the patient. No language interpreter was used.    Past Medical History  Diagnosis Date  . DVT (deep venous thrombosis)     in legs and chest in past.  . Depressed affect   . Hypertension   . Diabetes mellitus without complication   . Pneumonia   . COPD (chronic obstructive pulmonary disease)   . History of hip replacement, total    Past Surgical History  Procedure Laterality Date  . Hip arthroplasty    . Appendectomy     No family history on file. History  Substance Use Topics  . Smoking status: Current Every Day Smoker -- 0.50 packs/day    Types: Cigarettes  . Smokeless tobacco: Current User  . Alcohol Use: No    Review of Systems  Constitutional: Negative.   Respiratory: Negative.   Cardiovascular: Negative.     Allergies  Codeine-chlorpheniramine-apap; Phenergan; and Ultram  Home Medications   Current Outpatient Rx  Name  Route  Sig  Dispense  Refill  . azithromycin (ZITHROMAX) 250 MG tablet   Oral   Take 1 tablet (250 mg total) by mouth daily. Take first 2 tablets together, then 1 every day until finished.   6 tablet   0   . buPROPion (WELLBUTRIN SR) 150 MG 12 hr tablet   Oral   Take 150 mg by mouth 2 (two) times daily.         . cephALEXin (KEFLEX) 500 MG capsule   Oral   Take 1 capsule (500 mg total) by mouth 4 (four) times daily.   28 capsule   0   . cyclobenzaprine (FLEXERIL) 10 MG tablet   Oral   Take 1 tablet (10 mg total) by mouth 2 (two) times daily as needed for muscle spasms.   15 tablet   0   . diazepam (VALIUM) 10 MG tablet   Oral   Take 10 mg by mouth every 6 (six) hours as needed for anxiety.         Marland Kitchen doxycycline (VIBRAMYCIN) 100 MG capsule   Oral   Take 1 capsule (100 mg total) by mouth 2 (two) times daily. One po bid x 7 days   14 capsule   0   . escitalopram (LEXAPRO) 20 MG tablet   Oral   Take 20 mg by mouth daily.         Marland Kitchen glipiZIDE (GLUCOTROL) 10 MG tablet   Oral   Take 10 mg by mouth 2 (two) times daily before a meal.         . HYDROcodone-acetaminophen (NORCO) 5-325 MG per tablet   Oral   Take 2 tablets by mouth every 4 (four) hours as needed for pain.   20 tablet   0   . HYDROcodone-acetaminophen (NORCO) 5-325 MG per tablet   Oral   Take 2 tablets by mouth every 4 (four) hours as needed for pain.   20 tablet   0   . HYDROcodone-acetaminophen (NORCO) 5-325 MG per tablet   Oral   Take 2 tablets by mouth  every 4 (four) hours as needed for pain.   20 tablet   0   . HYDROcodone-acetaminophen (NORCO/VICODIN) 5-325 MG per tablet   Oral   Take 1 tablet by mouth every 6 (six) hours as needed for pain.   20 tablet   0   . HYDROcodone-acetaminophen (NORCO/VICODIN) 5-325 MG per tablet   Oral   Take 2 tablets by mouth every 4 (four) hours as needed for pain.   6 tablet   0   . HYDROcodone-homatropine (HYCODAN) 5-1.5 MG/5ML syrup   Oral   Take 5 mLs by mouth every 6 (six) hours as needed for cough.   120 mL   0   . insulin aspart protamine-insulin aspart (NOVOLOG 70/30) (70-30) 100 UNIT/ML injection   Subcutaneous   Inject into the skin 2 (two) times daily with a meal.         . lisinopril (PRINIVIL,ZESTRIL) 20 MG tablet   Oral   Take 20 mg by mouth daily.         . meloxicam (MOBIC) 7.5 MG tablet   Oral   Take 1 tablet (7.5 mg total) by mouth daily.   7 tablet   0   . metFORMIN (GLUCOPHAGE) 1000 MG tablet   Oral   Take 1,000 mg by mouth 1 day or 1 dose.         . oxybutynin (DITROPAN-XL) 10 MG 24 hr tablet   Oral    Take 10 mg by mouth daily.         Marland Kitchen oxybutynin (OXYTROL) 3.9 MG/24HR   Transdermal   Place 1 patch onto the skin 2 (two) times a week.         Marland Kitchen oxyCODONE-acetaminophen (PERCOCET) 5-325 MG per tablet   Oral   Take 1-2 tablets by mouth every 4 (four) hours as needed (for pain).   30 tablet   0   . oxyCODONE-acetaminophen (PERCOCET/ROXICET) 5-325 MG per tablet   Oral   Take 1-2 tablets by mouth every 6 (six) hours as needed for pain.   20 tablet   0   . oxyCODONE-acetaminophen (PERCOCET/ROXICET) 5-325 MG per tablet   Oral   Take 1 tablet by mouth every 4 (four) hours as needed for severe pain.   5 tablet   0   . predniSONE (DELTASONE) 20 MG tablet   Oral   Take 1 tablet (20 mg total) by mouth 2 (two) times daily.   14 tablet   0   . predniSONE (DELTASONE) 20 MG tablet   Oral   Take 1 tablet (20 mg total) by mouth 2 (two) times daily.   14 tablet   0    BP 134/68  Pulse 102  Temp(Src) 98.7 F (37.1 C) (Oral)  Resp 22  Ht 5\' 8"  (1.727 m)  Wt 315 lb (142.883 kg)  BMI 47.91 kg/m2  SpO2 92% Physical Exam  Nursing note and vitals reviewed. Constitutional: He appears well-developed and well-nourished.  Cardiovascular: Normal rate and regular rhythm.   Pulmonary/Chest: Effort normal and breath sounds normal.  Musculoskeletal:  No gross deformity to the right shoulder:neurvascularly intact    ED Course  Procedures (including critical care time) Labs Review Labs Reviewed - No data to display Imaging Review No results found.  EKG Interpretation   None       MDM   1. Humeral head fracture, right, with delayed healing, subsequent encounter    Will given percocet and refer back to ortho  Teressa Lower, NP 09/24/13 [phone]

## 2013-09-28 NOTE — ED Provider Notes (Signed)
Medical screening examination/treatment/procedure(s) were performed by non-physician practitioner and as supervising physician I was immediately available for consultation/collaboration.  EKG Interpretation   None         Candyce Churn, MD 09/28/13 [phone]

## 2013-10-10 ENCOUNTER — Emergency Department (HOSPITAL_BASED_OUTPATIENT_CLINIC_OR_DEPARTMENT_OTHER): Payer: Medicare Other

## 2013-10-10 ENCOUNTER — Encounter (HOSPITAL_BASED_OUTPATIENT_CLINIC_OR_DEPARTMENT_OTHER): Payer: Self-pay | Admitting: Emergency Medicine

## 2013-10-10 ENCOUNTER — Emergency Department (HOSPITAL_BASED_OUTPATIENT_CLINIC_OR_DEPARTMENT_OTHER)
Admission: EM | Admit: 2013-10-10 | Discharge: 2013-10-10 | Disposition: A | Discharge status: Home / Self Care | Payer: Medicare Other | Attending: Emergency Medicine | Admitting: Emergency Medicine

## 2013-10-10 DIAGNOSIS — S42301S Unspecified fracture of shaft of humerus, right arm, sequela: Secondary | ICD-10-CM

## 2013-10-10 DIAGNOSIS — F329 Major depressive disorder, single episode, unspecified: Secondary | ICD-10-CM | POA: Insufficient documentation

## 2013-10-10 DIAGNOSIS — F17200 Nicotine dependence, unspecified, uncomplicated: Secondary | ICD-10-CM | POA: Insufficient documentation

## 2013-10-10 DIAGNOSIS — Y939 Activity, unspecified: Secondary | ICD-10-CM | POA: Insufficient documentation

## 2013-10-10 DIAGNOSIS — S46909A Unspecified injury of unspecified muscle, fascia and tendon at shoulder and upper arm level, unspecified arm, initial encounter: Secondary | ICD-10-CM | POA: Insufficient documentation

## 2013-10-10 DIAGNOSIS — I10 Essential (primary) hypertension: Secondary | ICD-10-CM | POA: Insufficient documentation

## 2013-10-10 DIAGNOSIS — IMO0002 Reserved for concepts with insufficient information to code with codable children: Secondary | ICD-10-CM | POA: Insufficient documentation

## 2013-10-10 DIAGNOSIS — J4489 Other specified chronic obstructive pulmonary disease: Secondary | ICD-10-CM | POA: Insufficient documentation

## 2013-10-10 DIAGNOSIS — Z794 Long term (current) use of insulin: Secondary | ICD-10-CM | POA: Insufficient documentation

## 2013-10-10 DIAGNOSIS — S4980XA Other specified injuries of shoulder and upper arm, unspecified arm, initial encounter: Secondary | ICD-10-CM | POA: Insufficient documentation

## 2013-10-10 DIAGNOSIS — Z791 Long term (current) use of non-steroidal anti-inflammatories (NSAID): Secondary | ICD-10-CM | POA: Insufficient documentation

## 2013-10-10 DIAGNOSIS — Z8701 Personal history of pneumonia (recurrent): Secondary | ICD-10-CM | POA: Insufficient documentation

## 2013-10-10 DIAGNOSIS — Z96649 Presence of unspecified artificial hip joint: Secondary | ICD-10-CM | POA: Insufficient documentation

## 2013-10-10 DIAGNOSIS — W2209XA Striking against other stationary object, initial encounter: Secondary | ICD-10-CM | POA: Insufficient documentation

## 2013-10-10 DIAGNOSIS — Z79899 Other long term (current) drug therapy: Secondary | ICD-10-CM | POA: Insufficient documentation

## 2013-10-10 DIAGNOSIS — Z792 Long term (current) use of antibiotics: Secondary | ICD-10-CM | POA: Insufficient documentation

## 2013-10-10 DIAGNOSIS — F3289 Other specified depressive episodes: Secondary | ICD-10-CM | POA: Insufficient documentation

## 2013-10-10 DIAGNOSIS — Y929 Unspecified place or not applicable: Secondary | ICD-10-CM | POA: Insufficient documentation

## 2013-10-10 DIAGNOSIS — Z86718 Personal history of other venous thrombosis and embolism: Secondary | ICD-10-CM | POA: Insufficient documentation

## 2013-10-10 DIAGNOSIS — J449 Chronic obstructive pulmonary disease, unspecified: Secondary | ICD-10-CM | POA: Insufficient documentation

## 2013-10-10 MED ORDER — OXYCODONE-ACETAMINOPHEN 5-325 MG PO TABS
1.0000 | ORAL_TABLET | Freq: Once | ORAL | Status: AC
  Administered 2013-10-10: 1 via ORAL
  Filled 2013-10-10: qty 1
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
## 2013-10-23 ENCOUNTER — Emergency Department (HOSPITAL_BASED_OUTPATIENT_CLINIC_OR_DEPARTMENT_OTHER)
Admission: EM | Admit: 2013-10-23 | Discharge: 2013-10-23 | Disposition: A | Discharge status: Home / Self Care | Payer: Medicare Other | Attending: Emergency Medicine | Admitting: Emergency Medicine

## 2013-10-23 ENCOUNTER — Emergency Department (HOSPITAL_BASED_OUTPATIENT_CLINIC_OR_DEPARTMENT_OTHER): Payer: Medicare Other

## 2013-10-23 ENCOUNTER — Encounter (HOSPITAL_BASED_OUTPATIENT_CLINIC_OR_DEPARTMENT_OTHER): Payer: Self-pay | Admitting: Emergency Medicine

## 2013-10-23 DIAGNOSIS — Z791 Long term (current) use of non-steroidal anti-inflammatories (NSAID): Secondary | ICD-10-CM | POA: Insufficient documentation

## 2013-10-23 DIAGNOSIS — J4489 Other specified chronic obstructive pulmonary disease: Secondary | ICD-10-CM | POA: Insufficient documentation

## 2013-10-23 DIAGNOSIS — Z86718 Personal history of other venous thrombosis and embolism: Secondary | ICD-10-CM | POA: Insufficient documentation

## 2013-10-23 DIAGNOSIS — Z794 Long term (current) use of insulin: Secondary | ICD-10-CM | POA: Insufficient documentation

## 2013-10-23 DIAGNOSIS — Y939 Activity, unspecified: Secondary | ICD-10-CM | POA: Insufficient documentation

## 2013-10-23 DIAGNOSIS — Z96649 Presence of unspecified artificial hip joint: Secondary | ICD-10-CM | POA: Insufficient documentation

## 2013-10-23 DIAGNOSIS — I10 Essential (primary) hypertension: Secondary | ICD-10-CM | POA: Insufficient documentation

## 2013-10-23 DIAGNOSIS — F17200 Nicotine dependence, unspecified, uncomplicated: Secondary | ICD-10-CM | POA: Insufficient documentation

## 2013-10-23 DIAGNOSIS — S62639A Displaced fracture of distal phalanx of unspecified finger, initial encounter for closed fracture: Secondary | ICD-10-CM | POA: Insufficient documentation

## 2013-10-23 DIAGNOSIS — Z792 Long term (current) use of antibiotics: Secondary | ICD-10-CM | POA: Insufficient documentation

## 2013-10-23 DIAGNOSIS — E119 Type 2 diabetes mellitus without complications: Secondary | ICD-10-CM | POA: Insufficient documentation

## 2013-10-23 DIAGNOSIS — Z79899 Other long term (current) drug therapy: Secondary | ICD-10-CM | POA: Insufficient documentation

## 2013-10-23 DIAGNOSIS — IMO0002 Reserved for concepts with insufficient information to code with codable children: Secondary | ICD-10-CM | POA: Insufficient documentation

## 2013-10-23 DIAGNOSIS — F3289 Other specified depressive episodes: Secondary | ICD-10-CM | POA: Insufficient documentation

## 2013-10-23 DIAGNOSIS — F329 Major depressive disorder, single episode, unspecified: Secondary | ICD-10-CM | POA: Insufficient documentation

## 2013-10-23 DIAGNOSIS — Z8701 Personal history of pneumonia (recurrent): Secondary | ICD-10-CM | POA: Insufficient documentation

## 2013-10-23 DIAGNOSIS — W1809XA Striking against other object with subsequent fall, initial encounter: Secondary | ICD-10-CM | POA: Insufficient documentation

## 2013-10-23 DIAGNOSIS — J449 Chronic obstructive pulmonary disease, unspecified: Secondary | ICD-10-CM | POA: Insufficient documentation

## 2013-10-23 DIAGNOSIS — Y929 Unspecified place or not applicable: Secondary | ICD-10-CM | POA: Insufficient documentation

## 2013-10-23 MED ORDER — HYDROCODONE-ACETAMINOPHEN 5-325 MG PO TABS: 2.0000 | ORAL_TABLET | ORAL | Status: AC | PRN

## 2013-10-23 MED ORDER — OXYCODONE-ACETAMINOPHEN 5-325 MG PO TABS
1.0000 | ORAL_TABLET | Freq: Once | ORAL | Status: AC
  Administered 2013-10-23: 1 via ORAL
  Filled 2013-10-23: qty 1

## 2013-10-23 NOTE — Discharge Instructions (Signed)
Finger Fracture  Fractures of fingers are breaks in the bones of the fingers. There are many types of fractures. There are different ways of treating these fractures, all of which can be correct. Your caregiver will discuss the best way to treat your fracture.  TREATMENT   Finger fractures can be treated with:   · Non-reduction - this means the bones are in place. The finger is splinted without changing the positions of the bone pieces. The splint is usually left on for about a week to ten days. This will depend on your fracture and what your caregiver thinks.  · Closed reduction - the bones are put back into position without using surgery. The finger is then splinted.  · ORIF (open reduction and internal fixation) - the fracture site is opened. Then the bone pieces are fixed into place with pins or some type of hardware. This is seldom required. It depends on the severity of the fracture.  Your caregiver will discuss the type of fracture you have and the treatment that will be best for that problem. If surgery is the treatment of choice, the following is information for you to know and also let your caregiver know about prior to surgery.  LET YOUR CAREGIVER KNOW ABOUT:  · Allergies  · Medications taken including herbs, eye drops, over the counter medications, and creams  · Use of steroids (by mouth or creams)  · Previous problems with anesthetics or Novocaine  · Possibility of pregnancy, if this applies  · History of blood clots (thrombophlebitis)  · History of bleeding or blood problems  · Previous surgery  · Other health problems  AFTER THE PROCEDURE  After surgery, you will be taken to the recovery area where a nurse will check your progress. Once you're awake, stable, and taking fluids well, barring other problems you will be allowed to go home. Once home an ice pack applied to your operative site may help with discomfort and keep the swelling down.  HOME CARE INSTRUCTIONS   · Follow your caregiver's  instructions as to activities, exercises, physical therapy, and driving a car.  · Use your finger and exercise as directed.  · Only take over-the-counter or prescription medicines for pain, discomfort, or fever as directed by your caregiver. Do not take aspirin until your caregiver OK's it, as this can increase bleeding immediately following surgery.  · Stop using ibuprofen if it upsets your stomach. Let your caregiver know about it.  SEEK MEDICAL CARE IF:  · You have increased bleeding (more than a small spot) from the wound or from beneath your splint.  · You develop redness, swelling, or increasing pain in the wound or from beneath your splint.  · There is pus coming from the wound or from beneath your splint.  · An unexplained oral temperature above 102° F (38.9° C) develops, or as your caregiver suggests.  · There is a foul smell coming from the wound or dressing or from beneath your splint.  SEEK IMMEDIATE MEDICAL CARE IF:   · You develop a rash.  · You have difficulty breathing.  · You have any allergic problems.  MAKE SURE YOU:   · Understand these instructions.  · Will watch your condition.  · Will get help right away if you are not doing well or get worse.  Document Released: 01/15/2001 Document Revised: 12/26/2011 Document Reviewed: 05/22/2008  [employer]® Patient Information ©2014 [employer].

## 2013-10-23 NOTE — ED Provider Notes (Signed)
Medical screening examination/treatment/procedure(s) were performed by non-physician practitioner and as supervising physician I was immediately available for consultation/collaboration.  EKG Interpretation   None        Ethelda ChickMartha K Linker, MD 10/23/13 1240
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
## 2013-10-23 NOTE — ED Provider Notes (Signed)
CSN: [id number]     Arrival date & time 10/23/13  1036 History   First MD Initiated Contact with Patient 10/23/13 1210     Chief Complaint  Patient presents with  . Fall  . Finger Injury   (Consider location/radiation/quality/duration/timing/severity/associated sxs/prior Treatment) Patient is a 58 y.o. male presenting with fall. The history is provided by the patient. No language interpreter was used.  Fall This is a new problem. The current episode started today. The problem occurs constantly. The problem has been gradually worsening. Associated symptoms include joint swelling. Associated symptoms comments: Finger injury. Nothing aggravates the symptoms. He has tried nothing for the symptoms. The treatment provided moderate relief.   Pt fell and hit his right 4th finger.   Past Medical History  Diagnosis Date  . DVT (deep venous thrombosis)     in legs and chest in past.  . Depressed affect   . Hypertension   . Diabetes mellitus without complication   . Pneumonia   . COPD (chronic obstructive pulmonary disease)   . History of hip replacement, total    Past Surgical History  Procedure Laterality Date  . Hip arthroplasty    . Appendectomy     No family history on file. History  Substance Use Topics  . Smoking status: Current Every Day Smoker -- 0.50 packs/day    Types: Cigarettes  . Smokeless tobacco: Current User  . Alcohol Use: No    Review of Systems  Musculoskeletal: Positive for joint swelling.  All other systems reviewed and are negative.    Allergies  Codeine-chlorpheniramine-apap; Phenergan; Ultram; and Toradol  Home Medications   Current Outpatient Rx  Name  Route  Sig  Dispense  Refill  . azithromycin (ZITHROMAX) 250 MG tablet   Oral   Take 1 tablet (250 mg total) by mouth daily. Take first 2 tablets together, then 1 every day until finished.   6 tablet   0   . buPROPion (WELLBUTRIN SR) 150 MG 12 hr tablet   Oral   Take 150 mg by mouth 2 (two) times  daily.         . cephALEXin (KEFLEX) 500 MG capsule   Oral   Take 1 capsule (500 mg total) by mouth 4 (four) times daily.   28 capsule   0   . cyclobenzaprine (FLEXERIL) 10 MG tablet   Oral   Take 1 tablet (10 mg total) by mouth 2 (two) times daily as needed for muscle spasms.   15 tablet   0   . diazepam (VALIUM) 10 MG tablet   Oral   Take 10 mg by mouth every 6 (six) hours as needed for anxiety.         Marland Kitchen. doxycycline (VIBRAMYCIN) 100 MG capsule   Oral   Take 1 capsule (100 mg total) by mouth 2 (two) times daily. One po bid x 7 days   14 capsule   0   . escitalopram (LEXAPRO) 20 MG tablet   Oral   Take 20 mg by mouth daily.         Marland Kitchen. glipiZIDE (GLUCOTROL) 10 MG tablet   Oral   Take 10 mg by mouth 2 (two) times daily before a meal.         . HYDROcodone-acetaminophen (NORCO) 5-325 MG per tablet   Oral   Take 2 tablets by mouth every 4 (four) hours as needed for pain.   20 tablet   0   . HYDROcodone-acetaminophen (NORCO) 5-325 MG  per tablet   Oral   Take 2 tablets by mouth every 4 (four) hours as needed for pain.   20 tablet   0   . HYDROcodone-acetaminophen (NORCO) 5-325 MG per tablet   Oral   Take 2 tablets by mouth every 4 (four) hours as needed for pain.   20 tablet   0   . HYDROcodone-acetaminophen (NORCO/VICODIN) 5-325 MG per tablet   Oral   Take 1 tablet by mouth every 6 (six) hours as needed for pain.   20 tablet   0   . HYDROcodone-acetaminophen (NORCO/VICODIN) 5-325 MG per tablet   Oral   Take 2 tablets by mouth every 4 (four) hours as needed for pain.   6 tablet   0   . HYDROcodone-homatropine (HYCODAN) 5-1.5 MG/5ML syrup   Oral   Take 5 mLs by mouth every 6 (six) hours as needed for cough.   120 mL   0   . insulin aspart protamine-insulin aspart (NOVOLOG 70/30) (70-30) 100 UNIT/ML injection   Subcutaneous   Inject into the skin 2 (two) times daily with a meal.         . lisinopril (PRINIVIL,ZESTRIL) 20 MG tablet   Oral    Take 20 mg by mouth daily.         . meloxicam (MOBIC) 7.5 MG tablet   Oral   Take 1 tablet (7.5 mg total) by mouth daily.   7 tablet   0   . metFORMIN (GLUCOPHAGE) 1000 MG tablet   Oral   Take 1,000 mg by mouth 1 day or 1 dose.         . oxybutynin (DITROPAN-XL) 10 MG 24 hr tablet   Oral   Take 10 mg by mouth daily.         Marland Kitchen oxybutynin (OXYTROL) 3.9 MG/24HR   Transdermal   Place 1 patch onto the skin 2 (two) times a week.         Marland Kitchen oxyCODONE-acetaminophen (PERCOCET) 5-325 MG per tablet   Oral   Take 1-2 tablets by mouth every 4 (four) hours as needed (for pain).   30 tablet   0   . oxyCODONE-acetaminophen (PERCOCET/ROXICET) 5-325 MG per tablet   Oral   Take 1-2 tablets by mouth every 6 (six) hours as needed for pain.   20 tablet   0   . oxyCODONE-acetaminophen (PERCOCET/ROXICET) 5-325 MG per tablet   Oral   Take 1 tablet by mouth every 4 (four) hours as needed for severe pain.   5 tablet   0   . predniSONE (DELTASONE) 20 MG tablet   Oral   Take 1 tablet (20 mg total) by mouth 2 (two) times daily.   14 tablet   0   . predniSONE (DELTASONE) 20 MG tablet   Oral   Take 1 tablet (20 mg total) by mouth 2 (two) times daily.   14 tablet   0    BP 144/85  Pulse 94  Temp(Src) 98.2 F (36.8 C) (Oral)  Resp 20  Ht 5\' 8"  (1.727 m)  Wt 296 lb (134.265 kg)  BMI 45.02 kg/m2  SpO2 100% Physical Exam  Constitutional: He appears well-developed.  Musculoskeletal: He exhibits tenderness.  Swollen tender right 4th finger from nv and ns intact  Neurological: He is alert. He has normal reflexes.  Skin: Skin is warm.  Psychiatric: He has a normal mood and affect.    ED Course  Procedures (including critical care time) Labs Review  Labs Reviewed - No data to display Imaging Review Dg Hand Complete Right  10/23/2013   CLINICAL DATA:  Right ring finger pain after injury.  EXAM: RIGHT HAND - COMPLETE 3+ VIEW  COMPARISON:  None.  FINDINGS: There is a dorsal plate  avulsion fracture of the distal phalanx of the right ring finger. No other acute bony or joint abnormality is identified. Osteophytosis about the DIP joints of the long and ring fingers is noted.  IMPRESSION: Dorsal plate avulsion fracture distal phalanx right ring finger.   Electronically Signed   By: Drusilla Kanner M.D.   On: 10/23/2013 11:14    EKG Interpretation   None       MDM   1. Fracture of distal phalanx of finger, closed, initial encounter    Splint,  Pt advised to follow up with his Primary care MD    Elson Areas, PA-C 10/23/13 1235

## 2013-12-26 ENCOUNTER — Emergency Department (HOSPITAL_BASED_OUTPATIENT_CLINIC_OR_DEPARTMENT_OTHER)
Admission: EM | Admit: 2013-12-26 | Discharge: 2013-12-26 | Disposition: A | Discharge status: Home / Self Care | Payer: Medicare HMO | Attending: Emergency Medicine | Admitting: Emergency Medicine

## 2013-12-26 ENCOUNTER — Encounter (HOSPITAL_BASED_OUTPATIENT_CLINIC_OR_DEPARTMENT_OTHER): Payer: Self-pay | Admitting: Emergency Medicine

## 2013-12-26 ENCOUNTER — Emergency Department (HOSPITAL_BASED_OUTPATIENT_CLINIC_OR_DEPARTMENT_OTHER): Payer: Medicare HMO

## 2013-12-26 DIAGNOSIS — S20219A Contusion of unspecified front wall of thorax, initial encounter: Secondary | ICD-10-CM | POA: Insufficient documentation

## 2013-12-26 DIAGNOSIS — Z791 Long term (current) use of non-steroidal anti-inflammatories (NSAID): Secondary | ICD-10-CM | POA: Insufficient documentation

## 2013-12-26 DIAGNOSIS — Y929 Unspecified place or not applicable: Secondary | ICD-10-CM | POA: Insufficient documentation

## 2013-12-26 DIAGNOSIS — F3289 Other specified depressive episodes: Secondary | ICD-10-CM | POA: Insufficient documentation

## 2013-12-26 DIAGNOSIS — IMO0002 Reserved for concepts with insufficient information to code with codable children: Secondary | ICD-10-CM | POA: Insufficient documentation

## 2013-12-26 DIAGNOSIS — Z79899 Other long term (current) drug therapy: Secondary | ICD-10-CM | POA: Insufficient documentation

## 2013-12-26 DIAGNOSIS — Y9389 Activity, other specified: Secondary | ICD-10-CM | POA: Insufficient documentation

## 2013-12-26 DIAGNOSIS — Z794 Long term (current) use of insulin: Secondary | ICD-10-CM | POA: Insufficient documentation

## 2013-12-26 DIAGNOSIS — S0003XA Contusion of scalp, initial encounter: Secondary | ICD-10-CM | POA: Insufficient documentation

## 2013-12-26 DIAGNOSIS — S20211A Contusion of right front wall of thorax, initial encounter: Secondary | ICD-10-CM

## 2013-12-26 DIAGNOSIS — S0083XA Contusion of other part of head, initial encounter: Secondary | ICD-10-CM

## 2013-12-26 DIAGNOSIS — I10 Essential (primary) hypertension: Secondary | ICD-10-CM | POA: Insufficient documentation

## 2013-12-26 DIAGNOSIS — S1093XA Contusion of unspecified part of neck, initial encounter: Secondary | ICD-10-CM

## 2013-12-26 DIAGNOSIS — W19XXXA Unspecified fall, initial encounter: Secondary | ICD-10-CM

## 2013-12-26 DIAGNOSIS — Z86718 Personal history of other venous thrombosis and embolism: Secondary | ICD-10-CM | POA: Insufficient documentation

## 2013-12-26 DIAGNOSIS — W010XXA Fall on same level from slipping, tripping and stumbling without subsequent striking against object, initial encounter: Secondary | ICD-10-CM | POA: Insufficient documentation

## 2013-12-26 DIAGNOSIS — J4489 Other specified chronic obstructive pulmonary disease: Secondary | ICD-10-CM | POA: Insufficient documentation

## 2013-12-26 DIAGNOSIS — Z8701 Personal history of pneumonia (recurrent): Secondary | ICD-10-CM | POA: Insufficient documentation

## 2013-12-26 DIAGNOSIS — Z96649 Presence of unspecified artificial hip joint: Secondary | ICD-10-CM | POA: Insufficient documentation

## 2013-12-26 DIAGNOSIS — F17200 Nicotine dependence, unspecified, uncomplicated: Secondary | ICD-10-CM | POA: Insufficient documentation

## 2013-12-26 DIAGNOSIS — Z792 Long term (current) use of antibiotics: Secondary | ICD-10-CM | POA: Insufficient documentation

## 2013-12-26 DIAGNOSIS — E119 Type 2 diabetes mellitus without complications: Secondary | ICD-10-CM | POA: Insufficient documentation

## 2013-12-26 DIAGNOSIS — F329 Major depressive disorder, single episode, unspecified: Secondary | ICD-10-CM | POA: Insufficient documentation

## 2013-12-26 DIAGNOSIS — J449 Chronic obstructive pulmonary disease, unspecified: Secondary | ICD-10-CM | POA: Insufficient documentation

## 2013-12-26 MED ORDER — OXYCODONE-ACETAMINOPHEN 5-325 MG PO TABS: 1.0000 | ORAL_TABLET | ORAL | Status: AC | PRN

## 2013-12-26 MED ORDER — OXYCODONE-ACETAMINOPHEN 5-325 MG PO TABS
1.0000 | ORAL_TABLET | Freq: Once | ORAL | Status: AC
Start: 2013-12-26 — End: 2013-12-26
  Administered 2013-12-26: 1 via ORAL
  Filled 2013-12-26: qty 1

## 2013-12-26 NOTE — ED Notes (Signed)
Patient transported to X-ray 

## 2013-12-26 NOTE — Discharge Instructions (Signed)
Apply ice to the area. Rest take the pain medication as needed. Do not take the pain medication if you are driving as it will make you sleepy.
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
## 2013-12-26 NOTE — ED Provider Notes (Signed)
CSN: [id number]     Arrival date & time 12/26/13  1612 History   First MD Initiated Contact with Patient 12/26/13 1643     Chief Complaint  Patient presents with  . Fall     (Consider location/radiation/quality/duration/timing/severity/associated sxs/prior Treatment) Patient is a 58 y.o. male presenting with fall. The history is provided by the patient.  Fall This is a new problem. The current episode started today. The problem has been unchanged. Pertinent negatives include no abdominal pain, chest pain, chills, fever, headaches, nausea, neck pain or vomiting.   Raymond Kim is a 58 y.o. male who presents to the ED with pain and bruising to the right side of his face after he slipped and fell in water this morning. He also complains of pain in the right rib area with bruising. He has an abrasion to the right forearm.    Past Medical History  Diagnosis Date  . DVT (deep venous thrombosis)     in legs and chest in past.  . Depressed affect   . Hypertension   . Diabetes mellitus without complication   . Pneumonia   . COPD (chronic obstructive pulmonary disease)   . History of hip replacement, total    Past Surgical History  Procedure Laterality Date  . Hip arthroplasty    . Appendectomy     No family history on file. History  Substance Use Topics  . Smoking status: Current Every Day Smoker -- 0.50 packs/day    Types: Cigarettes  . Smokeless tobacco: Current User  . Alcohol Use: No    Review of Systems  Constitutional: Negative for fever and chills.  HENT: Facial swelling: bruising right cheek.   Eyes: Negative for visual disturbance.  Respiratory: Negative for shortness of breath.   Cardiovascular: Negative for chest pain.  Gastrointestinal: Negative for nausea, vomiting and abdominal pain.  Genitourinary: Negative for frequency.  Musculoskeletal: Negative for back pain and neck pain.       Right rib pain  Neurological: Negative for syncope and headaches.    Psychiatric/Behavioral: Negative for confusion. The patient is not nervous/anxious.       Allergies  Codeine-chlorpheniramine-apap; Phenergan; Ultram; and Toradol  Home Medications   Current Outpatient Rx  Name  Route  Sig  Dispense  Refill  . azithromycin (ZITHROMAX) 250 MG tablet   Oral   Take 1 tablet (250 mg total) by mouth daily. Take first 2 tablets together, then 1 every day until finished.   6 tablet   0   . buPROPion (WELLBUTRIN SR) 150 MG 12 hr tablet   Oral   Take 150 mg by mouth 2 (two) times daily.         . cephALEXin (KEFLEX) 500 MG capsule   Oral   Take 1 capsule (500 mg total) by mouth 4 (four) times daily.   28 capsule   0   . cyclobenzaprine (FLEXERIL) 10 MG tablet   Oral   Take 1 tablet (10 mg total) by mouth 2 (two) times daily as needed for muscle spasms.   15 tablet   0   . diazepam (VALIUM) 10 MG tablet   Oral   Take 10 mg by mouth every 6 (six) hours as needed for anxiety.         Marland Kitchen doxycycline (VIBRAMYCIN) 100 MG capsule   Oral   Take 1 capsule (100 mg total) by mouth 2 (two) times daily. One po bid x 7 days   14 capsule   0   .  escitalopram (LEXAPRO) 20 MG tablet   Oral   Take 20 mg by mouth daily.         Marland Kitchen glipiZIDE (GLUCOTROL) 10 MG tablet   Oral   Take 10 mg by mouth 2 (two) times daily before a meal.         . HYDROcodone-acetaminophen (NORCO) 5-325 MG per tablet   Oral   Take 2 tablets by mouth every 4 (four) hours as needed for pain.   20 tablet   0   . HYDROcodone-acetaminophen (NORCO) 5-325 MG per tablet   Oral   Take 2 tablets by mouth every 4 (four) hours as needed for pain.   20 tablet   0   . HYDROcodone-acetaminophen (NORCO) 5-325 MG per tablet   Oral   Take 2 tablets by mouth every 4 (four) hours as needed for pain.   20 tablet   0   . HYDROcodone-acetaminophen (NORCO/VICODIN) 5-325 MG per tablet   Oral   Take 1 tablet by mouth every 6 (six) hours as needed for pain.   20 tablet   0   .  HYDROcodone-acetaminophen (NORCO/VICODIN) 5-325 MG per tablet   Oral   Take 2 tablets by mouth every 4 (four) hours as needed for pain.   6 tablet   0   . HYDROcodone-acetaminophen (NORCO/VICODIN) 5-325 MG per tablet   Oral   Take 2 tablets by mouth every 4 (four) hours as needed.   20 tablet   0   . HYDROcodone-homatropine (HYCODAN) 5-1.5 MG/5ML syrup   Oral   Take 5 mLs by mouth every 6 (six) hours as needed for cough.   120 mL   0   . insulin aspart protamine-insulin aspart (NOVOLOG 70/30) (70-30) 100 UNIT/ML injection   Subcutaneous   Inject into the skin 2 (two) times daily with a meal.         . lisinopril (PRINIVIL,ZESTRIL) 20 MG tablet   Oral   Take 20 mg by mouth daily.         . meloxicam (MOBIC) 7.5 MG tablet   Oral   Take 1 tablet (7.5 mg total) by mouth daily.   7 tablet   0   . metFORMIN (GLUCOPHAGE) 1000 MG tablet   Oral   Take 1,000 mg by mouth 1 day or 1 dose.         . oxybutynin (DITROPAN-XL) 10 MG 24 hr tablet   Oral   Take 10 mg by mouth daily.         Marland Kitchen oxybutynin (OXYTROL) 3.9 MG/24HR   Transdermal   Place 1 patch onto the skin 2 (two) times a week.         Marland Kitchen oxyCODONE-acetaminophen (PERCOCET) 5-325 MG per tablet   Oral   Take 1-2 tablets by mouth every 4 (four) hours as needed (for pain).   30 tablet   0   . oxyCODONE-acetaminophen (PERCOCET/ROXICET) 5-325 MG per tablet   Oral   Take 1-2 tablets by mouth every 6 (six) hours as needed for pain.   20 tablet   0   . oxyCODONE-acetaminophen (PERCOCET/ROXICET) 5-325 MG per tablet   Oral   Take 1 tablet by mouth every 4 (four) hours as needed for severe pain.   5 tablet   0   . predniSONE (DELTASONE) 20 MG tablet   Oral   Take 1 tablet (20 mg total) by mouth 2 (two) times daily.   14 tablet   0   .  predniSONE (DELTASONE) 20 MG tablet   Oral   Take 1 tablet (20 mg total) by mouth 2 (two) times daily.   14 tablet   0    BP 134/66  Pulse 88  Temp(Src) 98.7 F (37.1  C) (Oral)  Resp 20  Ht 5\' 8"  (1.727 m)  Wt 296 lb (134.265 kg)  BMI 45.02 kg/m2  SpO2 97% Physical Exam  Nursing note and vitals reviewed. Constitutional: He is oriented to person, place, and time. He appears well-developed and well-nourished. No distress.  HENT:  Head:    Right Ear: Tympanic membrane normal.  Left Ear: Tympanic membrane normal.  Nose: Nose normal.  Mouth/Throat: Uvula is midline, oropharynx is clear and moist and mucous membranes are normal.  Ecchymosis right cheek  Eyes: Conjunctivae and EOM are normal. Pupils are equal, round, and reactive to light.  Neck: Normal range of motion. Neck supple.  Cardiovascular: Normal rate and regular rhythm.   Pulmonary/Chest: Effort normal. He has no wheezes. He has no rales.  Tender on palpation right anterior ribs with ecchymosis noted.   Abdominal: Soft. There is no tenderness.  Musculoskeletal: Normal range of motion.  Right forearm dorsal aspect with abrasion. Radial pulses strong, adequate circulation, good touch sensation. Good strength and equal bilateral.   Neurological: He is alert and oriented to person, place, and time. He has normal strength. No cranial nerve deficit or sensory deficit. Gait normal.  Skin: Skin is warm and dry.  Psychiatric: He has a normal mood and affect. His behavior is normal.   Dg Ribs Unilateral W/chest Right  12/26/2013   CLINICAL DATA:  Recent traumatic injury with right-sided chest pain  EXAM: RIGHT RIBS AND CHEST - 3+ VIEW  COMPARISON:  12/12/2013  FINDINGS: Cardiac shadow is within normal limits. The lungs are well-aerated without focal infiltrate. No pneumothorax is noted. Multiple old rib fractures are noted on the right with healing. No definitive acute rib fracture is identified.  IMPRESSION: Old healed rib fractures on the right. No acute abnormality is noted.   Electronically Signed   By: Alcide Clever M.D.   On: 12/26/2013 17:21    ED Course  Procedures   MDM  58 y.o. male with  contusion to the right side of the face and right anterior ribs. I have reviewed this patient's vital signs, nurses notes, appropriate labs and imaging.  I have discussed findings with the patient and plan of care. Patient voices understanding.  Stable for discharge without shortness of breath. Vital signs normal with O2 SAT 97% on R/A.  Will treat pain and patient to return for worsening symptoms.    Medication List    ASK your doctor about these medications       azithromycin 250 MG tablet  Commonly known as:  ZITHROMAX  Take 1 tablet (250 mg total) by mouth daily. Take first 2 tablets together, then 1 every day until finished.     buPROPion 150 MG 12 hr tablet  Commonly known as:  WELLBUTRIN SR  Take 150 mg by mouth 2 (two) times daily.     cephALEXin 500 MG capsule  Commonly known as:  KEFLEX  Take 1 capsule (500 mg total) by mouth 4 (four) times daily.     cyclobenzaprine 10 MG tablet  Commonly known as:  FLEXERIL  Take 1 tablet (10 mg total) by mouth 2 (two) times daily as needed for muscle spasms.     diazepam 10 MG tablet  Commonly known as:  VALIUM  Take 10 mg by mouth every 6 (six) hours as needed for anxiety.     doxycycline 100 MG capsule  Commonly known as:  VIBRAMYCIN  Take 1 capsule (100 mg total) by mouth 2 (two) times daily. One po bid x 7 days     escitalopram 20 MG tablet  Commonly known as:  LEXAPRO  Take 20 mg by mouth daily.     glipiZIDE 10 MG tablet  Commonly known as:  GLUCOTROL  Take 10 mg by mouth 2 (two) times daily before a meal.     HYDROcodone-acetaminophen 5-325 MG per tablet  Commonly known as:  NORCO  Take 2 tablets by mouth every 4 (four) hours as needed for pain.     HYDROcodone-acetaminophen 5-325 MG per tablet  Commonly known as:  NORCO  Take 2 tablets by mouth every 4 (four) hours as needed for pain.     HYDROcodone-acetaminophen 5-325 MG per tablet  Commonly known as:  NORCO/VICODIN  Take 1 tablet by mouth every 6 (six) hours  as needed for pain.     HYDROcodone-acetaminophen 5-325 MG per tablet  Commonly known as:  NORCO  Take 2 tablets by mouth every 4 (four) hours as needed for pain.     HYDROcodone-acetaminophen 5-325 MG per tablet  Commonly known as:  NORCO/VICODIN  Take 2 tablets by mouth every 4 (four) hours as needed for pain.     HYDROcodone-acetaminophen 5-325 MG per tablet  Commonly known as:  NORCO/VICODIN  Take 2 tablets by mouth every 4 (four) hours as needed.     HYDROcodone-homatropine 5-1.5 MG/5ML syrup  Commonly known as:  HYCODAN  Take 5 mLs by mouth every 6 (six) hours as needed for cough.     insulin aspart protamine- aspart (70-30) 100 UNIT/ML injection  Commonly known as:  NOVOLOG MIX 70/30  Inject into the skin 2 (two) times daily with a meal.     lisinopril 20 MG tablet  Commonly known as:  PRINIVIL,ZESTRIL  Take 20 mg by mouth daily.     meloxicam 7.5 MG tablet  Commonly known as:  MOBIC  Take 1 tablet (7.5 mg total) by mouth daily.     metFORMIN 1000 MG tablet  Commonly known as:  GLUCOPHAGE  Take 1,000 mg by mouth 1 day or 1 dose.     oxybutynin 10 MG 24 hr tablet  Commonly known as:  DITROPAN-XL  Take 10 mg by mouth daily.     oxybutynin 3.9 MG/24HR  Commonly known as:  OXYTROL  Place 1 patch onto the skin 2 (two) times a week.     oxyCODONE-acetaminophen 5-325 MG per tablet  Commonly known as:  PERCOCET/ROXICET  Take 1-2 tablets by mouth every 6 (six) hours as needed for pain.  Ask about: Which instructions should I use?     oxyCODONE-acetaminophen 5-325 MG per tablet  Commonly known as:  PERCOCET  Take 1-2 tablets by mouth every 4 (four) hours as needed (for pain).  Ask about: Which instructions should I use?     oxyCODONE-acetaminophen 5-325 MG per tablet  Commonly known as:  ROXICET  Take 1 tablet by mouth every 4 (four) hours as needed for severe pain.  Ask about: Which instructions should I use?     predniSONE 20 MG tablet  Commonly known as:   DELTASONE  Take 1 tablet (20 mg total) by mouth 2 (two) times daily.     predniSONE 20 MG tablet  Commonly known as:  DELTASONE  Take 1 tablet (20 mg total) by mouth 2 (two) times daily.          Janne NapoleonHope M Banks Chaikin, [place]NP 12/26/13 1751

## 2013-12-26 NOTE — ED Provider Notes (Signed)
Medical screening examination/treatment/procedure(s) were performed by non-physician practitioner and as supervising physician I was immediately available for consultation/collaboration.   EKG Interpretation None       Ethelda ChickMartha K Linker, MD 12/26/13 1753

## 2014-01-09 ENCOUNTER — Emergency Department (HOSPITAL_BASED_OUTPATIENT_CLINIC_OR_DEPARTMENT_OTHER)
Admission: EM | Admit: 2014-01-09 | Discharge: 2014-01-10 | Discharge status: Against Medical Advice | Payer: Medicare HMO | Attending: Emergency Medicine | Admitting: Emergency Medicine

## 2014-01-09 ENCOUNTER — Encounter (HOSPITAL_BASED_OUTPATIENT_CLINIC_OR_DEPARTMENT_OTHER): Payer: Self-pay | Admitting: Emergency Medicine

## 2014-01-09 DIAGNOSIS — J449 Chronic obstructive pulmonary disease, unspecified: Secondary | ICD-10-CM | POA: Insufficient documentation

## 2014-01-09 DIAGNOSIS — Z96649 Presence of unspecified artificial hip joint: Secondary | ICD-10-CM | POA: Insufficient documentation

## 2014-01-09 DIAGNOSIS — E119 Type 2 diabetes mellitus without complications: Secondary | ICD-10-CM | POA: Insufficient documentation

## 2014-01-09 DIAGNOSIS — S0993XA Unspecified injury of face, initial encounter: Secondary | ICD-10-CM | POA: Insufficient documentation

## 2014-01-09 DIAGNOSIS — F17200 Nicotine dependence, unspecified, uncomplicated: Secondary | ICD-10-CM | POA: Insufficient documentation

## 2014-01-09 DIAGNOSIS — I10 Essential (primary) hypertension: Secondary | ICD-10-CM | POA: Insufficient documentation

## 2014-01-09 DIAGNOSIS — Y929 Unspecified place or not applicable: Secondary | ICD-10-CM | POA: Insufficient documentation

## 2014-01-09 DIAGNOSIS — W010XXA Fall on same level from slipping, tripping and stumbling without subsequent striking against object, initial encounter: Secondary | ICD-10-CM | POA: Insufficient documentation

## 2014-01-09 DIAGNOSIS — S199XXA Unspecified injury of neck, initial encounter: Secondary | ICD-10-CM

## 2014-01-09 DIAGNOSIS — S20219A Contusion of unspecified front wall of thorax, initial encounter: Secondary | ICD-10-CM | POA: Insufficient documentation

## 2014-01-09 DIAGNOSIS — J4489 Other specified chronic obstructive pulmonary disease: Secondary | ICD-10-CM | POA: Insufficient documentation

## 2014-01-09 DIAGNOSIS — Y939 Activity, unspecified: Secondary | ICD-10-CM | POA: Insufficient documentation

## 2014-01-09 NOTE — ED Notes (Addendum)
Pt states slipped in water 2 weeks ago fell  Pain to rt lower chest w bruising and rt facial pain   Wants something for pain   Was seen here for same at time of fall

## 2014-01-09 NOTE — ED Notes (Signed)
3 weeks ago he slipped on water and fell. Bruising to his right ribs.

## 2014-01-10 ENCOUNTER — Encounter (HOSPITAL_BASED_OUTPATIENT_CLINIC_OR_DEPARTMENT_OTHER): Payer: Self-pay | Admitting: Emergency Medicine

## 2014-01-10 ENCOUNTER — Emergency Department (HOSPITAL_BASED_OUTPATIENT_CLINIC_OR_DEPARTMENT_OTHER)
Admission: EM | Admit: 2014-01-10 | Discharge: 2014-01-10 | Disposition: A | Discharge status: Home / Self Care | Payer: Medicare HMO | Attending: Emergency Medicine | Admitting: Emergency Medicine

## 2014-01-10 DIAGNOSIS — J4489 Other specified chronic obstructive pulmonary disease: Secondary | ICD-10-CM | POA: Insufficient documentation

## 2014-01-10 DIAGNOSIS — F329 Major depressive disorder, single episode, unspecified: Secondary | ICD-10-CM | POA: Insufficient documentation

## 2014-01-10 DIAGNOSIS — IMO0002 Reserved for concepts with insufficient information to code with codable children: Secondary | ICD-10-CM | POA: Insufficient documentation

## 2014-01-10 DIAGNOSIS — R0789 Other chest pain: Secondary | ICD-10-CM

## 2014-01-10 DIAGNOSIS — Z792 Long term (current) use of antibiotics: Secondary | ICD-10-CM | POA: Insufficient documentation

## 2014-01-10 DIAGNOSIS — R071 Chest pain on breathing: Secondary | ICD-10-CM | POA: Insufficient documentation

## 2014-01-10 DIAGNOSIS — F17200 Nicotine dependence, unspecified, uncomplicated: Secondary | ICD-10-CM | POA: Insufficient documentation

## 2014-01-10 DIAGNOSIS — Z96649 Presence of unspecified artificial hip joint: Secondary | ICD-10-CM | POA: Insufficient documentation

## 2014-01-10 DIAGNOSIS — J449 Chronic obstructive pulmonary disease, unspecified: Secondary | ICD-10-CM | POA: Insufficient documentation

## 2014-01-10 DIAGNOSIS — Z9181 History of falling: Secondary | ICD-10-CM | POA: Insufficient documentation

## 2014-01-10 DIAGNOSIS — E119 Type 2 diabetes mellitus without complications: Secondary | ICD-10-CM | POA: Insufficient documentation

## 2014-01-10 DIAGNOSIS — Z86718 Personal history of other venous thrombosis and embolism: Secondary | ICD-10-CM | POA: Insufficient documentation

## 2014-01-10 DIAGNOSIS — I10 Essential (primary) hypertension: Secondary | ICD-10-CM | POA: Insufficient documentation

## 2014-01-10 DIAGNOSIS — Z79899 Other long term (current) drug therapy: Secondary | ICD-10-CM | POA: Insufficient documentation

## 2014-01-10 DIAGNOSIS — F3289 Other specified depressive episodes: Secondary | ICD-10-CM | POA: Insufficient documentation

## 2014-01-10 DIAGNOSIS — G8911 Acute pain due to trauma: Secondary | ICD-10-CM | POA: Insufficient documentation

## 2014-01-10 DIAGNOSIS — Z8701 Personal history of pneumonia (recurrent): Secondary | ICD-10-CM | POA: Insufficient documentation

## 2014-01-10 DIAGNOSIS — Z791 Long term (current) use of non-steroidal anti-inflammatories (NSAID): Secondary | ICD-10-CM | POA: Insufficient documentation

## 2014-01-10 MED ORDER — HYDROCODONE-ACETAMINOPHEN 5-325 MG PO TABS: 2.0000 | ORAL_TABLET | ORAL | Status: AC | PRN

## 2014-01-10 NOTE — ED Notes (Signed)
Pt disappeared from room prior to being evaluated by md

## 2014-01-10 NOTE — ED Notes (Signed)
MD at bedside. 

## 2014-01-10 NOTE — ED Notes (Signed)
Slipped and fell in water a few weeks ago. Injury to his right ribs. Here for pain. Was here last night but left without being seen.

## 2014-01-10 NOTE — ED Provider Notes (Signed)
CSN: [id number]     Arrival date & time 01/10/14  1620 History   First MD Initiated Contact with Patient 01/10/14 1639     Chief Complaint  Patient presents with  . Fall  . Rib Injury     (Consider location/radiation/quality/duration/timing/severity/associated sxs/prior Treatment) HPI Comments: Patient is a 58 year old male with history of COPD and diabetes. Presents today with complaints of right-sided rib pain resulting from a fall approximately 2 weeks ago. He was seen here at that time and had x-rays performed which were unremarkable. He states that he remains in significant discomfort.  Patient is a 59 y.o. male presenting with fall. The history is provided by the patient.  Fall This is a new problem. Episode onset: 2 weeks ago. The problem occurs constantly. The problem has not changed since onset.Associated symptoms include chest pain. The symptoms are aggravated by coughing and sneezing. Nothing relieves the symptoms. He has tried nothing for the symptoms. The treatment provided no relief.    Past Medical History  Diagnosis Date  . DVT (deep venous thrombosis)     in legs and chest in past.  . Depressed affect   . Hypertension   . Diabetes mellitus without complication   . Pneumonia   . COPD (chronic obstructive pulmonary disease)   . History of hip replacement, total    Past Surgical History  Procedure Laterality Date  . Hip arthroplasty    . Appendectomy     No family history on file. History  Substance Use Topics  . Smoking status: Current Every Day Smoker -- 1.50 packs/day    Types: Cigarettes  . Smokeless tobacco: Current User  . Alcohol Use: No    Review of Systems  Cardiovascular: Positive for chest pain.  All other systems reviewed and are negative.      Allergies  Codeine-chlorpheniramine-apap; Phenergan; Ultram; and Toradol  Home Medications   Current Outpatient Rx  Name  Route  Sig  Dispense  Refill  . azithromycin (ZITHROMAX) 250 MG  tablet   Oral   Take 1 tablet (250 mg total) by mouth daily. Take first 2 tablets together, then 1 every day until finished.   6 tablet   0   . buPROPion (WELLBUTRIN SR) 150 MG 12 hr tablet   Oral   Take 150 mg by mouth 2 (two) times daily.         . cephALEXin (KEFLEX) 500 MG capsule   Oral   Take 1 capsule (500 mg total) by mouth 4 (four) times daily.   28 capsule   0   . cyclobenzaprine (FLEXERIL) 10 MG tablet   Oral   Take 1 tablet (10 mg total) by mouth 2 (two) times daily as needed for muscle spasms.   15 tablet   0   . diazepam (VALIUM) 10 MG tablet   Oral   Take 10 mg by mouth every 6 (six) hours as needed for anxiety.         Marland Kitchen doxycycline (VIBRAMYCIN) 100 MG capsule   Oral   Take 1 capsule (100 mg total) by mouth 2 (two) times daily. One po bid x 7 days   14 capsule   0   . escitalopram (LEXAPRO) 20 MG tablet   Oral   Take 20 mg by mouth daily.         Marland Kitchen glipiZIDE (GLUCOTROL) 10 MG tablet   Oral   Take 10 mg by mouth 2 (two) times daily before a meal.         .  HYDROcodone-acetaminophen (NORCO) 5-325 MG per tablet   Oral   Take 2 tablets by mouth every 4 (four) hours as needed for pain.   20 tablet   0   . HYDROcodone-acetaminophen (NORCO) 5-325 MG per tablet   Oral   Take 2 tablets by mouth every 4 (four) hours as needed for pain.   20 tablet   0   . HYDROcodone-acetaminophen (NORCO) 5-325 MG per tablet   Oral   Take 2 tablets by mouth every 4 (four) hours as needed for pain.   20 tablet   0   . HYDROcodone-acetaminophen (NORCO/VICODIN) 5-325 MG per tablet   Oral   Take 1 tablet by mouth every 6 (six) hours as needed for pain.   20 tablet   0   . HYDROcodone-acetaminophen (NORCO/VICODIN) 5-325 MG per tablet   Oral   Take 2 tablets by mouth every 4 (four) hours as needed for pain.   6 tablet   0   . HYDROcodone-acetaminophen (NORCO/VICODIN) 5-325 MG per tablet   Oral   Take 2 tablets by mouth every 4 (four) hours as needed.    20 tablet   0   . HYDROcodone-homatropine (HYCODAN) 5-1.5 MG/5ML syrup   Oral   Take 5 mLs by mouth every 6 (six) hours as needed for cough.   120 mL   0   . insulin aspart protamine-insulin aspart (NOVOLOG 70/30) (70-30) 100 UNIT/ML injection   Subcutaneous   Inject into the skin 2 (two) times daily with a meal.         . lisinopril (PRINIVIL,ZESTRIL) 20 MG tablet   Oral   Take 20 mg by mouth daily.         . meloxicam (MOBIC) 7.5 MG tablet   Oral   Take 1 tablet (7.5 mg total) by mouth daily.   7 tablet   0   . metFORMIN (GLUCOPHAGE) 1000 MG tablet   Oral   Take 1,000 mg by mouth 1 day or 1 dose.         . oxybutynin (DITROPAN-XL) 10 MG 24 hr tablet   Oral   Take 10 mg by mouth daily.         Marland Kitchen oxybutynin (OXYTROL) 3.9 MG/24HR   Transdermal   Place 1 patch onto the skin 2 (two) times a week.         Marland Kitchen oxyCODONE-acetaminophen (PERCOCET) 5-325 MG per tablet   Oral   Take 1-2 tablets by mouth every 4 (four) hours as needed (for pain).   30 tablet   0   . oxyCODONE-acetaminophen (PERCOCET/ROXICET) 5-325 MG per tablet   Oral   Take 1-2 tablets by mouth every 6 (six) hours as needed for pain.   20 tablet   0   . oxyCODONE-acetaminophen (ROXICET) 5-325 MG per tablet   Oral   Take 1 tablet by mouth every 4 (four) hours as needed for severe pain.   15 tablet   0   . predniSONE (DELTASONE) 20 MG tablet   Oral   Take 1 tablet (20 mg total) by mouth 2 (two) times daily.   14 tablet   0   . predniSONE (DELTASONE) 20 MG tablet   Oral   Take 1 tablet (20 mg total) by mouth 2 (two) times daily.   14 tablet   0    BP 144/64  Pulse 77  Temp(Src) 98 F (36.7 C) (Oral)  Resp 20  Wt 296 lb (134.265 kg)  SpO2  97% Physical Exam  Nursing note and vitals reviewed. Constitutional: He is oriented to person, place, and time. He appears well-developed and well-nourished. No distress.  HENT:  Head: Normocephalic and atraumatic.  Mouth/Throat: Oropharynx is  clear and moist.  Neck: Normal range of motion. Neck supple.  Cardiovascular: Normal rate, regular rhythm and normal heart sounds.   Pulmonary/Chest: Effort normal and breath sounds normal. No respiratory distress. He has no wheezes. He exhibits tenderness.  There is tenderness to palpation in the right chest wall.  Abdominal: Soft. Bowel sounds are normal. He exhibits no distension. There is no tenderness.  Musculoskeletal: Normal range of motion. He exhibits no edema.  Neurological: He is alert and oriented to person, place, and time.  Skin: Skin is warm and dry. He is not diaphoretic.    ED Course  Procedures (including critical care time) Labs Review Labs Reviewed - No data to display Imaging Review No results found.   EKG Interpretation None      MDM   Final diagnoses:  None    Patient presents with continued right-sided rib pain resulting from a fall 2 weeks ago. He was seen here 2 weeks ago for similar complaints, even though he denied this to me. He states he could not recall this visit. He has been in this ER multiple times with complaints of pain. [facility] [facility], it is apparent that he is to multiple narcotic prescriptions from multiple providers at multiple pharmacies. The database documentation compliant with his untruthfulness are concerning for drug-seeking behavior. I have advised him that I will prescribe him a limited number of hydrocodone and advised him that further refills need to come from his primary care physician. He understands this.    Geoffery Lyonsouglas Tatiyanna Lashley, MD 01/10/14 [phone]

## 2014-01-10 NOTE — Discharge Instructions (Signed)
Future refills for narcotic pain medications need to come from your primary care physician. We will no longer provide refills for these in the emergency department.   Chest Wall Pain Chest wall pain is pain in or around the bones and muscles of your chest. It may take up to 6 weeks to get better. It may take longer if you must stay physically active in your work and activities.  CAUSES  Chest wall pain may happen on its own. However, it may be caused by:  A viral illness like the flu.  Injury.  Coughing.  Exercise.  Arthritis.  Fibromyalgia.  Shingles. HOME CARE INSTRUCTIONS   Avoid overtiring physical activity. Try not to strain or perform activities that cause pain. This includes any activities using your chest or your abdominal and side muscles, especially if heavy weights are used.  Put ice on the sore area.  Put ice in a plastic bag.  Place a towel between your skin and the bag.  Leave the ice on for 15-20 minutes per hour while awake for the first 2 days.  Only take over-the-counter or prescription medicines for pain, discomfort, or fever as directed by your caregiver. SEEK IMMEDIATE MEDICAL CARE IF:   Your pain increases, or you are very uncomfortable.  You have a fever.  Your chest pain becomes worse.  You have new, unexplained symptoms.  You have nausea or vomiting.  You feel sweaty or lightheaded.  You have a cough with phlegm (sputum), or you cough up blood. MAKE SURE YOU:   Understand these instructions.  Will watch your condition.  Will get help right away if you are not doing well or get worse. Document Released: 10/03/2005 Document Revised: 12/26/2011 Document Reviewed: 05/30/2011 [facility] Patient Information 2014 [employer], [place].

## 2014-07-01 IMAGING — CR DG RIBS W/ CHEST 3+V*R*
4 series · 4 of 4 positions shown · non-contrast
Comparison: 12/12/2013

CLINICAL DATA: Recent traumatic injury with right-sided chest pain

EXAM:
RIGHT RIBS AND CHEST - 3+ VIEW

[w chest pa]
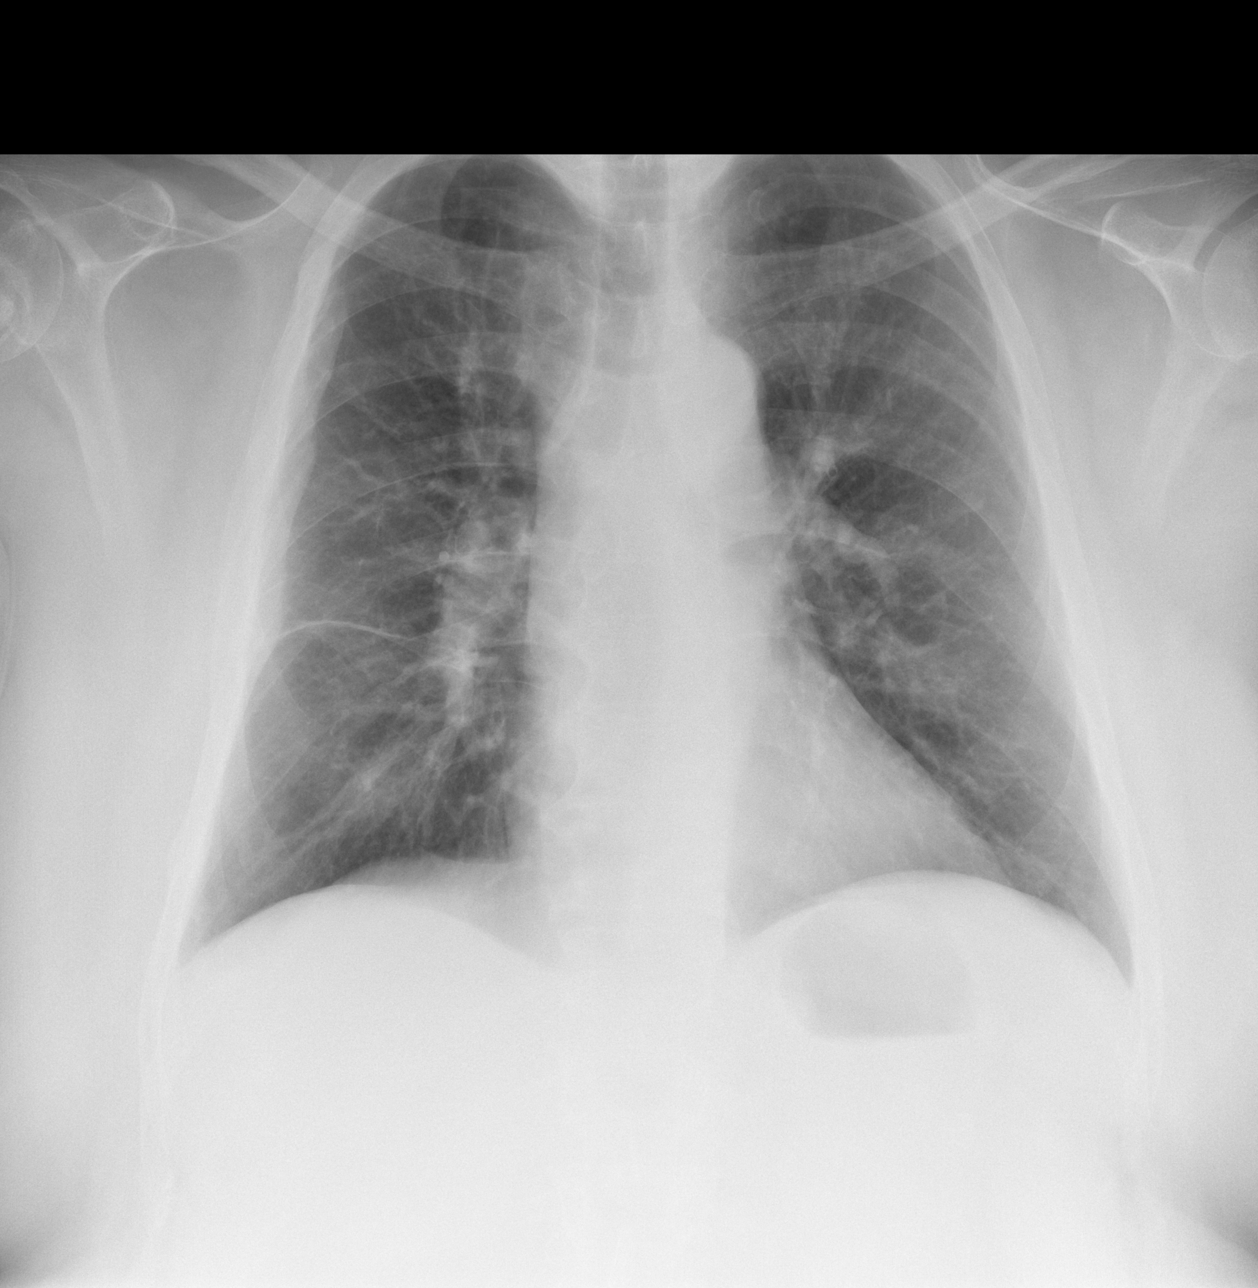

[w ribs ap/pa upper right]
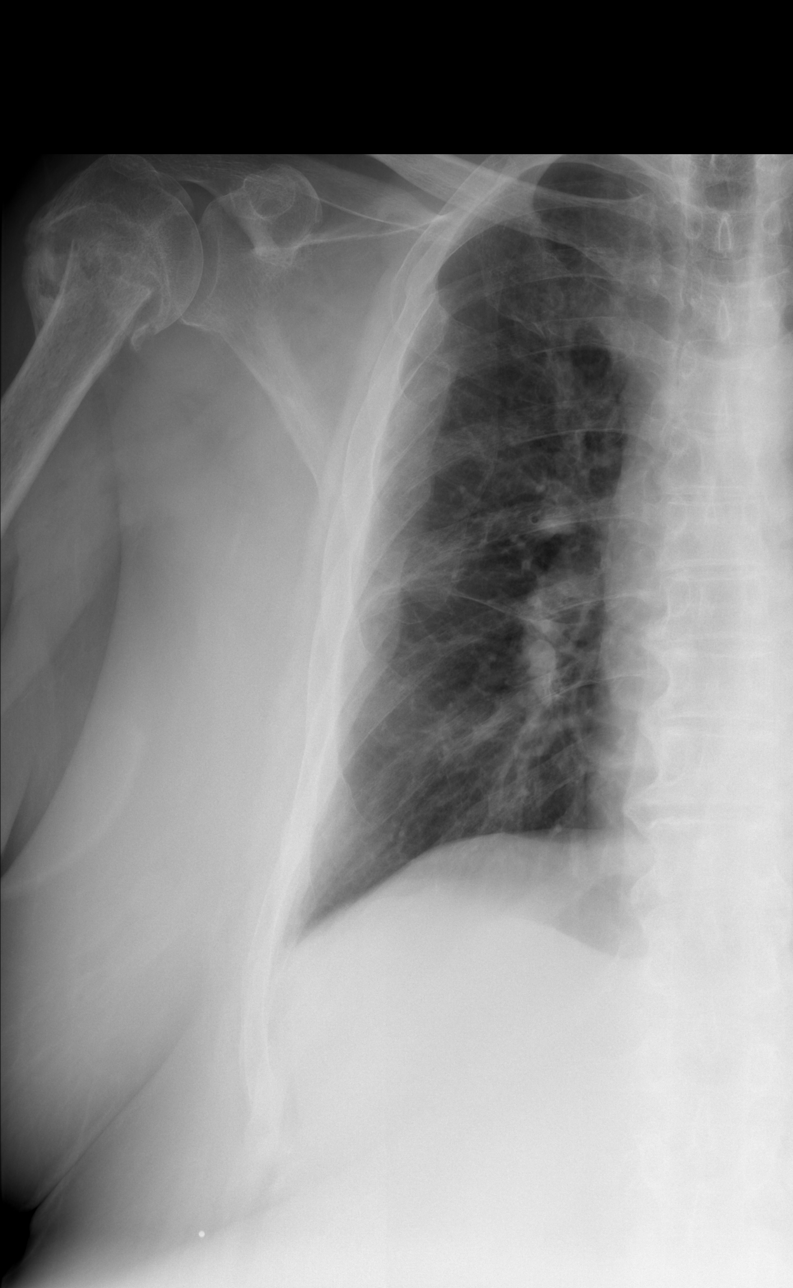

[w ribs ap/pa lower right]
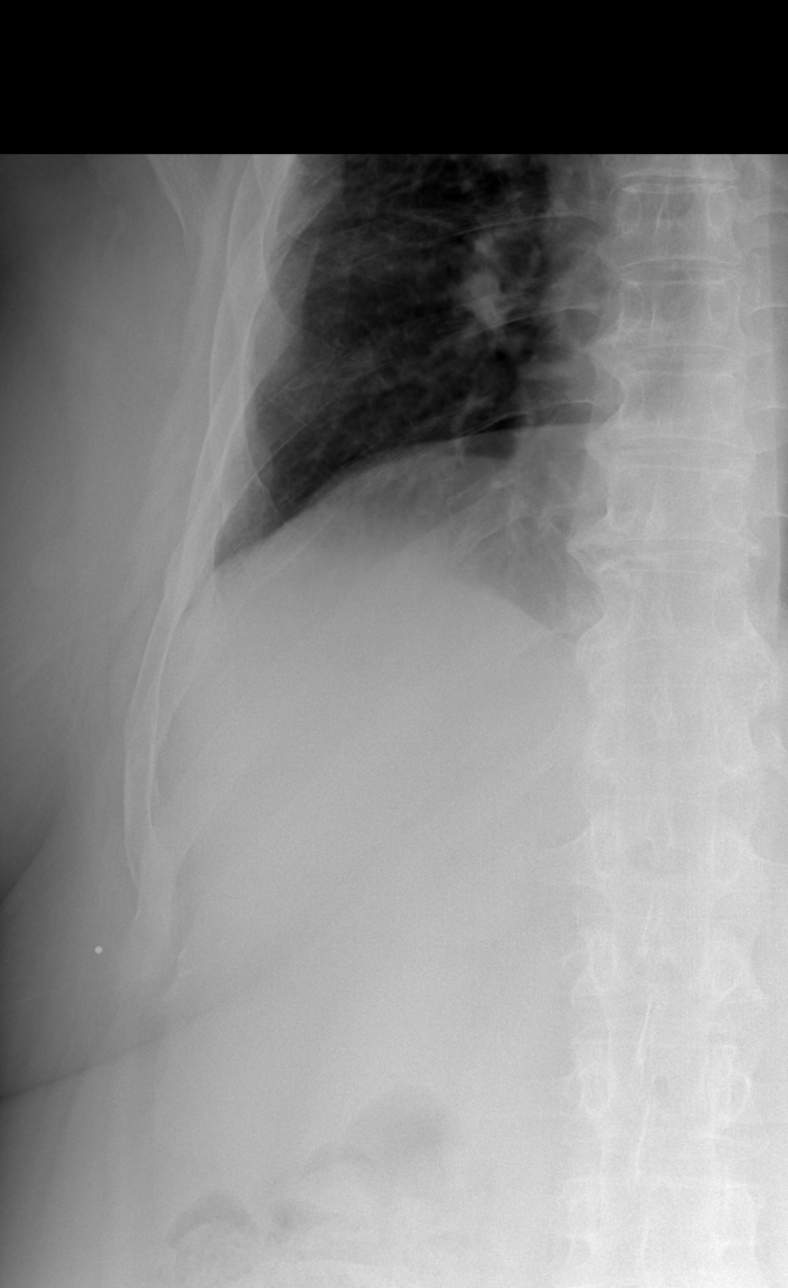

[w ribs oblique right]
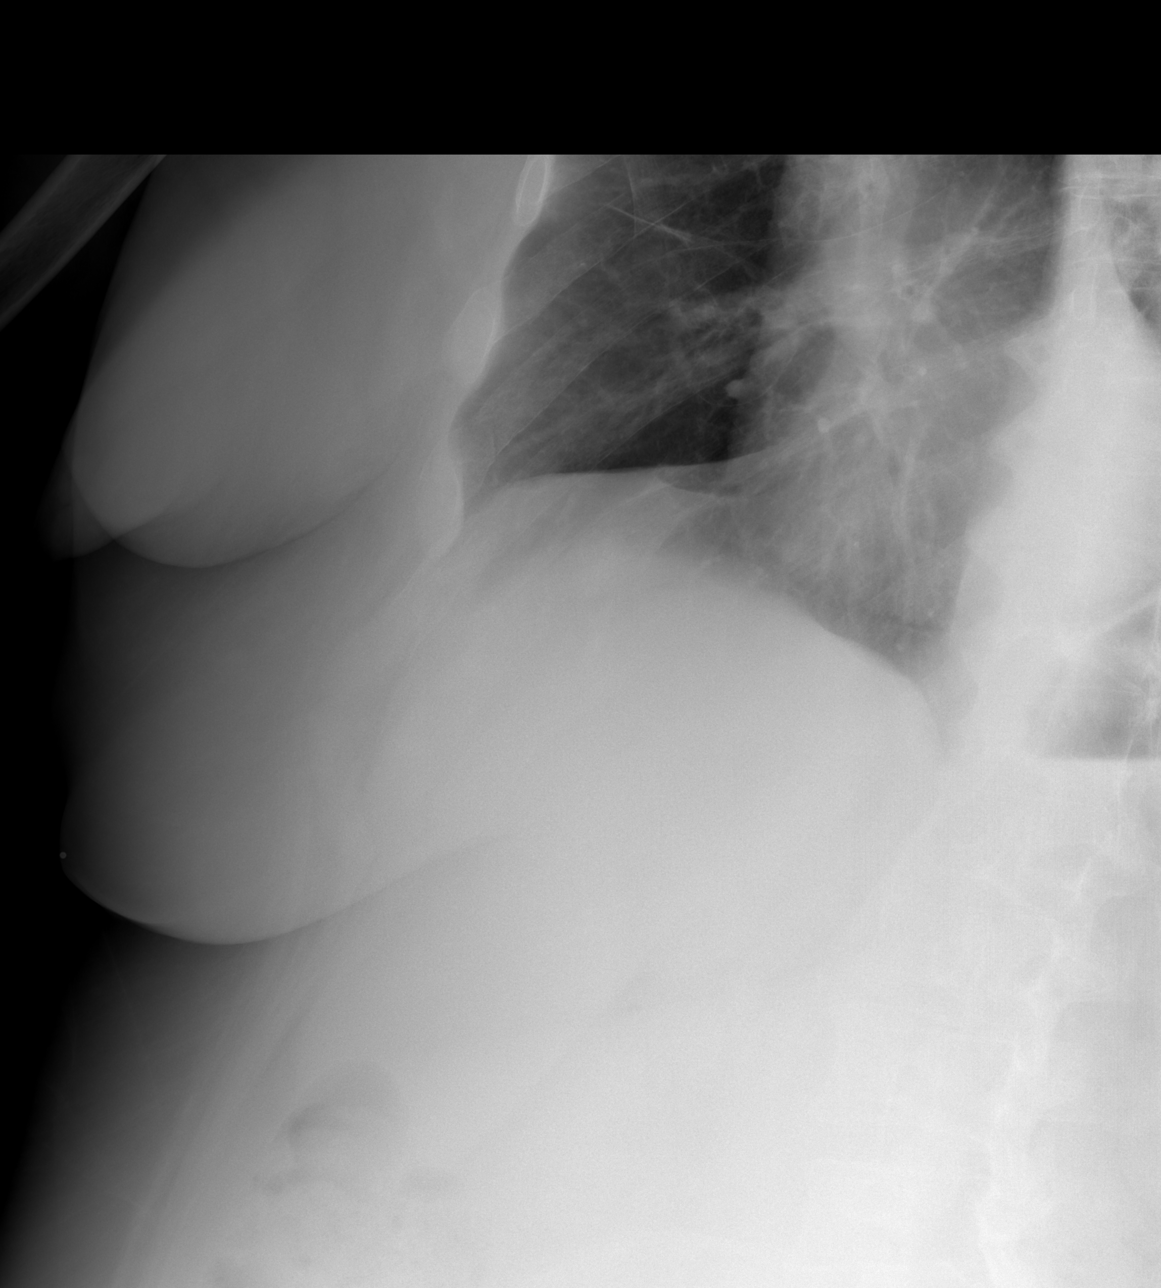

[4 of 4 positions shown; findings below may reference images not displayed]

FINDINGS: Cardiac shadow is within normal limits. The lungs are well-aerated
without focal infiltrate. No pneumothorax is noted. Multiple old rib
fractures are noted on the right with healing. No definitive acute
rib fracture is identified.
IMPRESSION: Old healed rib fractures on the right. No acute abnormality is
noted.

## 2015-11-18 DEATH — deceased
